# Patient Record
Sex: Female | Born: 1935 | Race: White | Hispanic: No | Smoking: Never smoker
Health system: Southern US, Community
[De-identification: ages and names within clinical notes are randomized; demographics above are authoritative.]

## PROBLEM LIST (undated history)

## (undated) DIAGNOSIS — E7800 Pure hypercholesterolemia, unspecified: Secondary | ICD-10-CM

## (undated) DIAGNOSIS — R1310 Dysphagia, unspecified: Secondary | ICD-10-CM

## (undated) DIAGNOSIS — K219 Gastro-esophageal reflux disease without esophagitis: Secondary | ICD-10-CM

## (undated) DIAGNOSIS — I2510 Atherosclerotic heart disease of native coronary artery without angina pectoris: Secondary | ICD-10-CM

## (undated) DIAGNOSIS — N2889 Other specified disorders of kidney and ureter: Secondary | ICD-10-CM

## (undated) DIAGNOSIS — I639 Cerebral infarction, unspecified: Secondary | ICD-10-CM

## (undated) DIAGNOSIS — Z8673 Personal history of transient ischemic attack (TIA), and cerebral infarction without residual deficits: Secondary | ICD-10-CM

## (undated) DIAGNOSIS — I10 Essential (primary) hypertension: Secondary | ICD-10-CM

## (undated) DIAGNOSIS — E785 Hyperlipidemia, unspecified: Secondary | ICD-10-CM

## (undated) DIAGNOSIS — Z818 Family history of other mental and behavioral disorders: Secondary | ICD-10-CM

## (undated) DIAGNOSIS — M7060 Trochanteric bursitis, unspecified hip: Secondary | ICD-10-CM

## (undated) DIAGNOSIS — Z780 Asymptomatic menopausal state: Secondary | ICD-10-CM

## (undated) DIAGNOSIS — M1990 Unspecified osteoarthritis, unspecified site: Secondary | ICD-10-CM

## (undated) DIAGNOSIS — H547 Unspecified visual loss: Secondary | ICD-10-CM

## (undated) DIAGNOSIS — I4891 Unspecified atrial fibrillation: Secondary | ICD-10-CM

## (undated) DIAGNOSIS — R569 Unspecified convulsions: Secondary | ICD-10-CM

## (undated) DIAGNOSIS — F0390 Unspecified dementia without behavioral disturbance: Secondary | ICD-10-CM

## (undated) HISTORY — DX: Atherosclerotic heart disease of native coronary artery without angina pectoris: I25.10

## (undated) HISTORY — DX: Pure hypercholesterolemia, unspecified: E78.00

## (undated) HISTORY — DX: Trochanteric bursitis, unspecified hip: M70.60

## (undated) HISTORY — DX: Unspecified visual loss: H54.7

## (undated) HISTORY — DX: Gastro-esophageal reflux disease without esophagitis: K21.9

## (undated) HISTORY — DX: Personal history of transient ischemic attack (TIA), and cerebral infarction without residual deficits: Z86.73

## (undated) HISTORY — DX: Family history of other mental and behavioral disorders: Z81.8

## (undated) HISTORY — DX: Unspecified dementia, unspecified severity, without behavioral disturbance, psychotic disturbance, mood disturbance, and anxiety: F03.90

## (undated) HISTORY — DX: Essential (primary) hypertension: I10

## (undated) HISTORY — DX: Asymptomatic menopausal state: Z78.0

---

## 2010-03-19 ENCOUNTER — Inpatient Hospital Stay (HOSPITAL_COMMUNITY): Admission: EM | Admit: 2010-03-19 | Discharge: 2010-03-20 | Payer: Self-pay | Admitting: Emergency Medicine

## 2010-03-19 ENCOUNTER — Ambulatory Visit: Payer: Self-pay | Admitting: Cardiology

## 2010-03-20 ENCOUNTER — Encounter: Payer: Self-pay | Admitting: Internal Medicine

## 2010-03-20 ENCOUNTER — Ambulatory Visit: Payer: Self-pay

## 2010-04-23 ENCOUNTER — Encounter: Payer: Self-pay | Admitting: Internal Medicine

## 2010-05-10 ENCOUNTER — Ambulatory Visit: Payer: Self-pay | Admitting: Internal Medicine

## 2010-05-10 DIAGNOSIS — E785 Hyperlipidemia, unspecified: Secondary | ICD-10-CM

## 2010-05-10 DIAGNOSIS — I2510 Atherosclerotic heart disease of native coronary artery without angina pectoris: Secondary | ICD-10-CM | POA: Insufficient documentation

## 2010-05-10 DIAGNOSIS — I10 Essential (primary) hypertension: Secondary | ICD-10-CM

## 2010-08-28 NOTE — Miscellaneous (Signed)
Summary: [facility] Progress Note   [facility] Cardiac Progress Note   Imported By: Roderic Ovens 05/16/2010 15:50:44  _____________________________________________________________________  External Attachment:    Type:   Image     Comment:   External Document

## 2010-08-28 NOTE — Assessment & Plan Note (Signed)
Summary: rov/jml   Visit Type:  Follow-up Primary Provider:  Dr Doristine Counter  CC:  no complaints.  History of Present Illness: Patient is an 77 year old who was admtted to [facility] aon 03/19/10 The patient was awoken that night by her husband who was acutely SOB.  EMS was called.  He was intubated.  During all of this she developed chest pains.  She too was admitted.  She rulded in for MI with a troponin of 1.1.  Cardiac cath showed mild CAD with 20% narrowings.  LV function was 50% with anterior hypokinesis.  It was felt that she had vasospasm.  She was treated medically. Of not during cath she had a short burst of tachycardia.   Not able to tell if afib. Since d/c she has had not further chest pains.  No palpitations.  Breathing is oK  Current Medications (verified): 1)  Aspirin 81 Mg  Tabs (Aspirin) .Marland Kitchen.. 1 Tab Once Daily 2)  Plavix 75 Mg Tabs (Clopidogrel Bisulfate) .... Take One Tablet By Mouth Daily X 1 Month 3)  Metoprolol Tartrate 25 Mg Tabs (Metoprolol Tartrate) .... Take One Half Tab Tablet By Mouth Twice A Day 4)  Nitrostat 0.4 Mg Subl (Nitroglycerin) .Marland Kitchen.. 1 Tablet Under Tongue At Onset of Chest Pain; You May Repeat Every 5 Minutes For Up To 3 Doses. 5)  Pravachol 40 Mg Tabs (Pravastatin Sodium) .Marland Kitchen.. 1 Tab Qd 6)  Furosemide .Marland Kitchen.. 1 Tab Once Daily  Allergies (verified): No Known Drug Allergies  Past History:  Family History: Last updated: 05/27/2010  Her mother died at 33 of old age.  Her father died at   24 of old age.  She has 7 siblings, none of which, have CAD.      Social History: Last updated: 2010/05/27  She lives in [place] with her husband.  She has 2   children.  She has a remote history of tobacco, but has not smoked in   years.  She denies any alcohol or IV drug use.   Past Medical History:  1. Dyslipidemia.   2. Hypertension.   3. CVA with left eye blindness.  4.  NSTEMI.  Mild CAD (20% LAD, LCx.  LVEF with 50% anterior hypokinesis)  Review of  Systems       BP is better at home.  Home cuff 100s  Vital Signs:  Patient profile:   77 year old female Height:      70 inches Weight:      150 pounds BMI:     21.60 Pulse rate:   70 / minute BP sitting:   158 / 88  (left arm) Cuff size:   regular  Vitals Entered By: Burnett Kanaris, CNA (May 27, 2010 11:45 AM)  Physical Exam  Additional Exam:  Pateint is in NAD HEENT:  Normocephalic, atraumatic. EOMI, PERRLA.  Neck: JVP is normal. No thyromegaly. No bruits.  Lungs: clear to auscultation. No rales no wheezes.  Heart: Regular rate and rhythm. Normal S1, S2. No S3.   No significant murmurs. PMI not displaced.  Abdomen:  Supple, nontender. Normal bowel sounds. No masses. No hepatomegaly.  Extremities:   Good distal pulses throughout. No lower extremity edema.  Musculoskeletal :moving all extremities.  Neuro:   alert and oriented x3.    EKG  Procedure date:  05/27/2010  Findings:      NSR.  71  Impression & Recommendations:  Problem # 1:  CAD, NATIVE VESSEL (ICD-414.01) Patient with recent NSTEMI.  Cath with mild dz.  NOte anterior hypokinesis on LV gram.  MAY have occurred secondary to spasm in setting of stress/increased catecholamines. Currently doing welll.  Volume looks good.  I would keep on same regimen.  D/C plavix when done. No recurrent rhythm problems as noted in cath lab.  Event monitor negative.  Problem # 2:  HYPERLIPIDEMIA-MIXED (ICD-272.4) Lipids will need to be followed. Her updated medication list for this problem includes:    Pravachol 40 Mg Tabs (Pravastatin sodium) .Marland Kitchen... 1 tab qd  Problem # 3:  ESSENTIAL HYPERTENSION, BENIGN (ICD-401.1) BP on my check was 120/80  Husband reports it is low at home.  Continue meds.  Patient is not dizzy.  Other Orders: EKG w/ Interpretation (93000)

## 2010-08-28 NOTE — Procedures (Signed)
Summary: Summary report  Summary report   Imported By: Erle Crocker 05/15/2010 11:47:06  _____________________________________________________________________  External Attachment:    Type:   Image     Comment:   External Document

## 2010-10-12 LAB — CBC
HCT: 32.5 % — ABNORMAL LOW (ref 36.0–46.0)
Hemoglobin: 11 g/dL — ABNORMAL LOW (ref 12.0–15.0)
MCH: 30.5 pg (ref 26.0–34.0)
MCH: 31.1 pg (ref 26.0–34.0)
MCHC: 33.8 g/dL (ref 30.0–36.0)
MCV: 90 fL (ref 78.0–100.0)
Platelets: 161 10*3/uL (ref 150–400)
RBC: 3.61 MIL/uL — ABNORMAL LOW (ref 3.87–5.11)
RBC: 3.96 MIL/uL (ref 3.87–5.11)
RDW: 12.8 % (ref 11.5–15.5)
WBC: 5.3 10*3/uL (ref 4.0–10.5)
WBC: 8.5 10*3/uL (ref 4.0–10.5)

## 2010-10-12 LAB — CARDIAC PANEL(CRET KIN+CKTOT+MB+TROPI)
CK, MB: 6.1 ng/mL (ref 0.3–4.0)
Relative Index: 5.4 — ABNORMAL HIGH (ref 0.0–2.5)
Relative Index: 5.7 — ABNORMAL HIGH (ref 0.0–2.5)
Total CK: 112 U/L (ref 7–177)
Total CK: 121 U/L (ref 7–177)
Troponin I: 1.63 ng/mL (ref 0.00–0.06)

## 2010-10-12 LAB — MRSA PCR SCREENING: MRSA by PCR: NEGATIVE

## 2010-10-12 LAB — PROTIME-INR: INR: 1.05 (ref 0.00–1.49)

## 2010-10-12 LAB — CK TOTAL AND CKMB (NOT AT ARMC)
CK, MB: 4.8 ng/mL — ABNORMAL HIGH (ref 0.3–4.0)
CK, MB: 5.8 ng/mL — ABNORMAL HIGH (ref 0.3–4.0)
Relative Index: 4.8 — ABNORMAL HIGH (ref 0.0–2.5)
Total CK: 101 U/L (ref 7–177)
Total CK: 111 U/L (ref 7–177)

## 2010-10-12 LAB — POCT CARDIAC MARKERS
CKMB, poc: 2.5 ng/mL (ref 1.0–8.0)
Myoglobin, poc: 93.2 ng/mL (ref 12–200)
Troponin i, poc: 0.48 ng/mL (ref 0.00–0.09)

## 2010-10-12 LAB — LIPID PANEL
Cholesterol: 173 mg/dL (ref 0–200)
HDL: 46 mg/dL (ref >39)
LDL Cholesterol: 102 mg/dL — ABNORMAL HIGH (ref 0–99)
Total CHOL/HDL Ratio: 3.8 RATIO
Triglycerides: 126 mg/dL (ref <150)
VLDL: 25 mg/dL (ref 0–40)

## 2010-10-12 LAB — POCT I-STAT, CHEM 8
BUN: 11 mg/dL (ref 6–23)
HCT: 37 % (ref 36.0–46.0)
Hemoglobin: 12.6 g/dL (ref 12.0–15.0)
Sodium: 137 mEq/L (ref 135–145)

## 2010-10-12 LAB — TSH: TSH: 3.654 u[IU]/mL (ref 0.350–4.500)

## 2010-10-12 LAB — COMPREHENSIVE METABOLIC PANEL
AST: 29 U/L (ref 0–37)
Alkaline Phosphatase: 77 U/L (ref 39–117)
BUN: 10 mg/dL (ref 6–23)
Calcium: 9 mg/dL (ref 8.4–10.5)
Creatinine, Ser: 1.01 mg/dL (ref 0.4–1.2)
GFR calc Af Amer: >60 mL/min (ref >60)
GFR calc non Af Amer: 52 mL/min — ABNORMAL LOW (ref >60)
Glucose, Bld: 127 mg/dL — ABNORMAL HIGH (ref 70–99)
Potassium: 3.7 mEq/L (ref 3.5–5.1)
Sodium: 138 mEq/L (ref 135–145)
Total Protein: 7.1 g/dL (ref 6.0–8.3)

## 2010-10-12 LAB — DIFFERENTIAL
Basophils Relative: 0 % (ref 0–1)
Neutro Abs: 5.9 10*3/uL (ref 1.7–7.7)

## 2010-10-12 LAB — TROPONIN I
Troponin I: 1.1 ng/mL (ref 0.00–0.06)
Troponin I: 1.31 ng/mL (ref 0.00–0.06)

## 2010-10-12 LAB — HEPARIN LEVEL (UNFRACTIONATED): Heparin Unfractionated: 0.24 IU/mL — ABNORMAL LOW (ref 0.30–0.70)

## 2011-03-08 ENCOUNTER — Other Ambulatory Visit: Payer: Self-pay | Admitting: Orthopedic Surgery

## 2011-03-08 ENCOUNTER — Ambulatory Visit (HOSPITAL_COMMUNITY)
Admission: RE | Admit: 2011-03-08 | Discharge: 2011-03-08 | Disposition: A | Discharge status: Home / Self Care | Payer: Medicare Other | Source: Ambulatory Visit | Attending: Orthopedic Surgery | Admitting: Orthopedic Surgery

## 2011-03-08 ENCOUNTER — Other Ambulatory Visit (HOSPITAL_COMMUNITY): Payer: Self-pay | Admitting: Orthopedic Surgery

## 2011-03-08 ENCOUNTER — Encounter (HOSPITAL_COMMUNITY): Payer: Medicare Other

## 2011-03-08 DIAGNOSIS — M169 Osteoarthritis of hip, unspecified: Secondary | ICD-10-CM | POA: Insufficient documentation

## 2011-03-08 DIAGNOSIS — Z01812 Encounter for preprocedural laboratory examination: Secondary | ICD-10-CM | POA: Insufficient documentation

## 2011-03-08 DIAGNOSIS — Z01818 Encounter for other preprocedural examination: Secondary | ICD-10-CM

## 2011-03-08 DIAGNOSIS — M1610 Unilateral primary osteoarthritis, unspecified hip: Secondary | ICD-10-CM | POA: Insufficient documentation

## 2011-03-08 DIAGNOSIS — I10 Essential (primary) hypertension: Secondary | ICD-10-CM | POA: Insufficient documentation

## 2011-03-08 LAB — SURGICAL PCR SCREEN
MRSA, PCR: NEGATIVE
Staphylococcus aureus: NEGATIVE

## 2011-03-08 LAB — URINALYSIS, ROUTINE W REFLEX MICROSCOPIC
Bilirubin Urine: NEGATIVE
Urobilinogen, UA: 0.2 mg/dL (ref 0.0–1.0)

## 2011-03-08 LAB — URINE MICROSCOPIC-ADD ON

## 2011-03-08 LAB — CBC
HCT: 38.6 % (ref 36.0–46.0)
Hemoglobin: 13.3 g/dL (ref 12.0–15.0)
MCHC: 34.5 g/dL (ref 30.0–36.0)
MCV: 89.1 fL (ref 78.0–100.0)
Platelets: 269 10*3/uL (ref 150–400)

## 2011-03-08 LAB — COMPREHENSIVE METABOLIC PANEL
ALT: 14 U/L (ref 0–35)
AST: 19 U/L (ref 0–37)
Albumin: 3.7 g/dL (ref 3.5–5.2)
Alkaline Phosphatase: 88 U/L (ref 39–117)
BUN: 15 mg/dL (ref 6–23)
CO2: 30 mEq/L (ref 19–32)
Chloride: 103 mEq/L (ref 96–112)
Creatinine, Ser: 0.95 mg/dL (ref 0.50–1.10)
GFR calc non Af Amer: 56 mL/min — ABNORMAL LOW (ref >60)
Glucose, Bld: 106 mg/dL — ABNORMAL HIGH (ref 70–99)
Total Bilirubin: 0.4 mg/dL (ref 0.3–1.2)
Total Protein: 7.7 g/dL (ref 6.0–8.3)

## 2011-03-18 ENCOUNTER — Inpatient Hospital Stay (HOSPITAL_COMMUNITY): Payer: Medicare Other

## 2011-03-18 ENCOUNTER — Inpatient Hospital Stay (HOSPITAL_COMMUNITY)
Admission: RE | Admit: 2011-03-18 | Discharge: 2011-03-21 | DRG: 470 | Disposition: A | Discharge status: Home / Self Care | Payer: Medicare Other | Source: Ambulatory Visit | Attending: Orthopedic Surgery | Admitting: Orthopedic Surgery

## 2011-03-18 DIAGNOSIS — Z01812 Encounter for preprocedural laboratory examination: Secondary | ICD-10-CM

## 2011-03-18 DIAGNOSIS — I10 Essential (primary) hypertension: Secondary | ICD-10-CM | POA: Diagnosis present

## 2011-03-18 DIAGNOSIS — M169 Osteoarthritis of hip, unspecified: Principal | ICD-10-CM | POA: Diagnosis present

## 2011-03-18 DIAGNOSIS — M1610 Unilateral primary osteoarthritis, unspecified hip: Principal | ICD-10-CM | POA: Diagnosis present

## 2011-03-18 DIAGNOSIS — R112 Nausea with vomiting, unspecified: Secondary | ICD-10-CM | POA: Diagnosis not present

## 2011-03-18 DIAGNOSIS — I2510 Atherosclerotic heart disease of native coronary artery without angina pectoris: Secondary | ICD-10-CM | POA: Diagnosis present

## 2011-03-18 LAB — TYPE AND SCREEN: Antibody Screen: NEGATIVE

## 2011-03-19 LAB — CBC
MCH: 30.8 pg (ref 26.0–34.0)
MCHC: 34.8 g/dL (ref 30.0–36.0)
Platelets: 234 10*3/uL (ref 150–400)
RDW: 12.3 % (ref 11.5–15.5)

## 2011-03-19 LAB — BASIC METABOLIC PANEL
Calcium: 8.6 mg/dL (ref 8.4–10.5)
GFR calc non Af Amer: >60 mL/min (ref >60)
Sodium: 133 mEq/L — ABNORMAL LOW (ref 135–145)

## 2011-03-20 LAB — BASIC METABOLIC PANEL
CO2: 29 mEq/L (ref 19–32)
Calcium: 8.8 mg/dL (ref 8.4–10.5)
Chloride: 102 mEq/L (ref 96–112)
Sodium: 136 mEq/L (ref 135–145)

## 2011-03-20 LAB — CBC
HCT: 28.6 % — ABNORMAL LOW (ref 36.0–46.0)
Hemoglobin: 9.6 g/dL — ABNORMAL LOW (ref 12.0–15.0)
MCH: 30.2 pg (ref 26.0–34.0)
MCHC: 33.6 g/dL (ref 30.0–36.0)
MCV: 89.9 fL (ref 78.0–100.0)
Platelets: 208 10*3/uL (ref 150–400)
RBC: 3.18 MIL/uL — ABNORMAL LOW (ref 3.87–5.11)
RDW: 12.4 % (ref 11.5–15.5)
WBC: 7.8 10*3/uL (ref 4.0–10.5)

## 2011-03-21 LAB — CBC
MCV: 89.3 fL (ref 78.0–100.0)
Platelets: 202 10*3/uL (ref 150–400)
RBC: 3.08 MIL/uL — ABNORMAL LOW (ref 3.87–5.11)
WBC: 7 10*3/uL (ref 4.0–10.5)

## 2011-03-28 NOTE — Op Note (Signed)
NAMEPASCHA, FOGAL NO.:  [id number]  MEDICAL RECORD NO.:  [id number]  LOCATION:  1614                         FACILITY:  [facility]  PHYSICIAN:  Ollen Gross, M.D.    DATE OF BIRTH:  06-11-1926  DATE OF PROCEDURE: DATE OF DISCHARGE:                              OPERATIVE REPORT   PREOPERATIVE DIAGNOSIS:  Osteoarthritis, right hip.  POSTOPERATIVE DIAGNOSIS:  Osteoarthritis, right hip.  PROCEDURE:  Right total hip arthroplasty.  SURGEON:  Ollen Gross, M.D.  ASSISTANT:  Alexzandrew L. Perkins, P.A.C.  ANESTHESIA:  General.  ESTIMATED BLOOD LOSS:  250.  DRAINS:  Hemovac x1.  COMPLICATIONS:  None.  CONDITION:  Stable to the Recovery.  BRIEF CLINICAL NOTE:  Ms. Baria is an 77 year old female with severe end-stage osteoarthritis of the right hip, bone-on-bone with severe pain and dysfunction.  She has had progressively worsening pain and progressively worsening function.  She has tried assistive devices,analgesics, and the pain persists.  She presents now for right total hip arthroplasty.  PROCEDURE IN DETAIL:  After successful administration of general anesthetic, the patient was placed in the left lateral decubitus position with the right side up and held with the hip positioner.  Right lower extremity was isolated from perineum with plastic drapes, and prepped and draped in the usual sterile fashion.  Short posterolateral incision was made with a 10 blade through the subcutaneous tissue to the level of fascia lata, which was incised in line with the skin incision. The sciatic nerve was palpated and protected, and the short rotators and capsule were isolated off the femur.  The hips dislocated and the center of femoral head was marked.  Trial prosthesis was placed such that the center of the trial head corresponds to the center of her native femoral head.  Osteotomy lines marked on the femoral neck and osteotomy was made with an oscillating saw.   The femoral head was removed and the retractors were placed  around the proximal femur to gain access to the canal.  The femoral canal was identified and entered with the starter reamer. The canal was thoroughly irrigated with saline to remove the fatty contents.  Lateralizing reamer was then placed.  We broached up to a size seven for the Summit cemented stem.  Broaches left in place.  The femur was then retracted anteriorly to gain acetabular exposure. Acetabular reaming was performed to 55 mm.  A 56-mm pinnacle acetabular shell was placed in anatomic position and transfixed with two dome screws.  Apex hole eliminator was placed and a 36-mm neutral plus 4 marathon liner was placed.  The trial neck was placed which is the 7 standard with a 36 +1 head.  The hips reduced with outstanding stability.  There was full extension, full external rotation, 70 degrees flexion, 40 degrees adduction, 90 degrees internal rotation, 90 degrees of flexion with 90 degrees of internal rotation.  By placing the right leg on top of the left, it felt as though leg lengths were equal.  The hip was dislocated and the trials were removed.  Sponge was placed in the acetabulum and the size specific  cement restrictor was trialed. The size #4 cement restrictor  was then placed in the appropriate depth in the femoral canal.  The canal was thoroughly prepared with pulsatile lavage and then the cement was mixed.  Once ready for implantation, the canal was thoroughly dried and then the cement injected and pressurized. The DePuy Summit size 7 cemented stem was then placed cementing in native anteversion.  All extruded cements removed.  Cement fully hardened, then the 32 +1.5 head was placed.  Hips reduced at the same stability parameters.  The wound was copiously irrigated with saline solution and then the short rotators and capsule reattached to the femur through drill holes with Ethibond suture.  The fascia lata was  closed over Hemovac drain with interrupted #1 Vicryl subcu.  The Exparel was injected, this was 20 mL mixed with 50 mL of saline.  It was injected into the fascia lata with the gluteal muscles and the subcutaneous tissues.  Subcu was then closed with interrupted #1-0 and #2-0 Vicryl and subcuticular running 4-0 Monocryl.  Incisions cleaned and dried and Steri-Strips and bulky sterile dressing were applied.  She was then placed into a knee immobilizer, awakened and transported to recovery in stable condition.     Ollen Gross, M.D.     FA/MEDQ  D:  03/18/2011  T:  03/19/2011  Job:  [id number]  Electronically Signed by Ollen Gross M.D. on 03/28/2011 04:03:35 PM

## 2011-04-03 NOTE — H&P (Addendum)
NAMELARAINE, SAMET NO.:  [id number]  MEDICAL RECORD NO.:  [id number]  LOCATION:                                 FACILITY:  PHYSICIAN:  Ollen Gross, M.D.    DATE OF BIRTH:  1926-03-03  DATE OF ADMISSION:  03/18/2011 DATE OF DISCHARGE:                             HISTORY & PHYSICAL   DATE OF ADMISSION:  03/18/2011  CHIEF COMPLAINT:  Right hip pain.  HISTORY OF PRESENT ILLNESS:  The patient is an 77 year old female who has seen by Dr. Lequita Halt for ongoing severe hip pain, has been progressively getting worse throughout the year.  Pain is in the lateral groin and radiating down.  She has had more difficulty and the pain has become quite severe and is limiting her functionality.  She has already advanced to the point where she has end-stage bone-on-bone with collapse of the femoral head superolaterally.  She has failed nonoperative management, now presents for a total knee arthroplasty.ALLERGIES:  No known drug allergies.  CURRENT MEDICATIONS:  Benazepril, furosemide, metoprolol, fish oil, aspirin, and pravastatin.  PAST MEDICAL HISTORY:  Atherosclerotic coronary vascular disease, hypertension, history of dementia, reflux disease, hypercholesterolemia, legal blindness left eye, history of trochanteric bursitis, history of ocular stroke, and postmenopausal.  PAST SURGICAL HISTORY:  Excision of Bartholin's gland and a hysterectomy.  FAMILY HISTORY:  Father deceased at age 96.  Mother deceased at age 23.  SOCIAL HISTORY:  Married, nonsmoker.  No alcohol.  Two adopted children. She does have a caregiver lined up.  She has one step entering her home.  REVIEW OF SYSTEMS:  GENERAL:  No fevers, chills or night sweats. NEUROLOGIC:  No seizures, syncope or paralysis.  She has ocular strokes and legally blind in the left eye, though.  RESPIRATORY:  No shortness of breath, productive cough or hemoptysis.  CARDIOVASCULAR:  No chest pain, orthopnea.  GI:  She does  have reflux.  No nausea, vomiting, diarrhea or constipation.  GU: No dysuria, hematuria or discharge. MUSCULOSKELETAL:  Hip pain.  PHYSICAL EXAM:  VITAL SIGNS:  Pulse 80, respirations 12, blood pressure 148/82. GENERAL:  77 year old white female, well-nourished, well-developed, in no acute distress.  She is alert, oriented and cooperative.  She is accompanied by her daughter, Ines Bloomer.  She does have upper and lower dentures. HEENT:  Normocephalic, atraumatic.  Pupils, round and reactive. Decreased vision in the left eye.  She has peripheral vision. NECK:  Supple. CHEST:  Clear. HEART:  Regular rate and rhythm without murmur, S1, S2 noted. ABDOMEN:  Soft, nontender, slightly round.  Bowel sounds present. RECTAL, BREASTS, GENITALIA:  Not done.  Not pertinant to present illness. EXTREMITIES:  Right hip:  Flexion 90, 0 internal rotation, 5 degrees external rotation, about 220 degrees abduction.  Right knee is 0 to 135 on passive range of motion.  Slight crepitation.  She does ambulate with an antalgic gait.  IMPRESSION:  Osteoarthritis, right hip.  PLAN:  The patient will be admitted to [facility] to undergo right total hip replacement arthroplasty.  Surgery will be performed by Dr. Ollen Gross.  There are no active contraindications for surgery and she has plans on going home  after the hospital stay.  She has been seen preoperatively by Dr. Doristine Counter and felt to be stable for surgery.  Dictated For Ollen Gross, MD     Alexzandrew L. Julien Girt, P.A.C.   ______________________________ Ollen Gross, M.D.    ALP/MEDQ  D:  03/24/2011  T:  03/24/2011  Job:  [id number]  cc:   Marjory Lies, M.D. Fax: [phone]  Electronically Signed by Patrica Duel P.A.C. on 04/03/2011 09:59:05 AM Electronically Signed by Ollen Gross M.D. on 04/03/2011 10:03:58 AM Electronically Signed by Ollen Gross M.D. on 04/03/2011 10:04:01 AM

## 2011-04-07 NOTE — Discharge Summary (Signed)
NAMELEYA, PAIGE NO.:  [id number]  MEDICAL RECORD NO.:  [id number]  LOCATION:  1614                         FACILITY:  [facility]  PHYSICIAN:  Ollen Gross, M.D.    DATE OF BIRTH:  July 02, 1926  DATE OF ADMISSION:  03/18/2011 DATE OF DISCHARGE:  03/21/2011                              DISCHARGE SUMMARY   ADMITTING DIAGNOSES: 1. Osteoarthritis, right hip. 2. Atherosclerotic coronary vascular disease. 3. Hypertension. 4. History of dementia. 5. Reflux disease. 6. Hypercholesterolemia. 7. Left eye legal blind. 8. History of trochanteric bursitis. 9. History of ocular stroke. 10.Postmenopausal.  DISCHARGE DIAGNOSES: 1. Osteoarthritis right hip, status post right total hip replacement     arthroplasty. 2. Mild postop acute blood loss anemia, did not require transfusion. 3. Postop hyponatremia, improved. 4. Atherosclerotic coronary vascular disease. 5. Hypertension. 6. History of dementia. 7. Reflux disease. 8. Hypercholesterolemia. 9. Left eye legal blind. 10.History of trochanteric bursitis. 11.History of ocular stroke. 12.Postmenopausal.  PROCEDURE:  March 18, 2011, right total hip.  Surgeon, Dr. Lequita Halt. Assistant, Patrica Duel, PA-C.  Anesthesia general.  Blood loss 250 cc.  CONSULTS:  None.  BRIEF HISTORY:  [name] is an 77 year old female with severe end- stage arthritis of the right hip, bone on bone, severe pain, and dysfunction.  She has failed nonoperative management, progressive worsening function, and now presents for total hip.  LABORATORY DATA:  Admission CBC is not scanned into the chart, not found in the computer chart, but hemoglobin starting was 13.3; postop hemoglobin down to 10.1 to 9.6; last done hemoglobin and hematocrit showed 9.4 and 27.5.  Admission Chem panel not scanned into the chart, not available, but the followup BMET showed a sodium low at 133, but it came back up to 136.  Remaining electrolytes all  remained within normal limits.  Blood group type O positive.  X-RAYS:  Postop pelvis and hip film shows the status post right total hip replacement without acute complicating features.  HOSPITAL COURSE:  The patient was admitted to [facility], taken to OR, underwent above-stated procedure without complication.  The patient tolerated procedure well, later transferred to recovery room, and then to the orthopedic floor.  Started on p.o. and IV analgesics. Given 24 hours postop IV antibiotics.  She was started on Xarelto for DVT prophylaxis.  Started getting up out of bed on day #1.  She had a decent night, a little bit of nausea and vomiting, but that had been resolved by day #1.  Sodium was a little low, felt to be a dilutional component, but she had good urinary output.  Pressure was stable. Starting getting up out of bed.  Started back on her blood pressure medications except for the ACE inhibitor that was held temporarily until her pressure was stable and she is moving around with established renal function.  By day #2, she was doing well, already sitting up in the chair on morning rounds.  Dressing changed, incision looked good. Hemoglobin was down to 9.6.  She was asymptomatic with this.  Her pressure was stable.  Sodium was already back up.  She continued to work with therapy and by day #3, she was up with  therapy, progressing with her activity goals, tolerating her medications, and was discharged home.  DISCHARGE PLAN: 1. The patient was discharged home on March 21, 2011. 2. Discharge diagnoses, please see above. 3. Discharge medications, OxyIR, Robaxin, Xarelto.  Continue home     medications, benazepril, furosemide, and sublingual nitroglycerin. 4. Diet, heart-healthy diet. 5. Activity, she is partial weightbearing 25% to 50%, hip precautions,     total hip protocol. 6. Follow up in 2 weeks.  DISPOSITION:  Home.  CONDITION UPON DISCHARGE:   Improved.     Alexzandrew L. Julien Girt, P.A.C.   ______________________________ Ollen Gross, M.D.    ALP/MEDQ  D:  04/03/2011  T:  04/03/2011  Job:  [id number]  cc:   Marjory Lies, M.D. Fax: [phone]  Electronically Signed by Patrica Duel P.A.C. on 04/04/2011 08:14:33 AM Electronically Signed by Ollen Gross M.D. on 04/07/2011 12:16:41 PM

## 2015-04-02 ENCOUNTER — Encounter (HOSPITAL_COMMUNITY): Payer: Self-pay | Admitting: *Deleted

## 2015-04-02 ENCOUNTER — Emergency Department (HOSPITAL_COMMUNITY): Payer: Medicare Other

## 2015-04-02 ENCOUNTER — Observation Stay (HOSPITAL_COMMUNITY)
Admission: EM | Admit: 2015-04-02 | Discharge: 2015-04-05 | Disposition: A | Discharge status: Home / Self Care | Payer: Medicare Other | Attending: Internal Medicine | Admitting: Internal Medicine

## 2015-04-02 DIAGNOSIS — Z8673 Personal history of transient ischemic attack (TIA), and cerebral infarction without residual deficits: Secondary | ICD-10-CM | POA: Diagnosis not present

## 2015-04-02 DIAGNOSIS — F0390 Unspecified dementia without behavioral disturbance: Secondary | ICD-10-CM | POA: Diagnosis present

## 2015-04-02 DIAGNOSIS — F03918 Unspecified dementia, unspecified severity, with other behavioral disturbance: Secondary | ICD-10-CM | POA: Insufficient documentation

## 2015-04-02 DIAGNOSIS — E785 Hyperlipidemia, unspecified: Secondary | ICD-10-CM | POA: Diagnosis not present

## 2015-04-02 DIAGNOSIS — I10 Essential (primary) hypertension: Secondary | ICD-10-CM | POA: Insufficient documentation

## 2015-04-02 DIAGNOSIS — G459 Transient cerebral ischemic attack, unspecified: Secondary | ICD-10-CM | POA: Diagnosis not present

## 2015-04-02 DIAGNOSIS — H5442 Blindness, left eye, normal vision right eye: Secondary | ICD-10-CM | POA: Diagnosis not present

## 2015-04-02 DIAGNOSIS — F0391 Unspecified dementia with behavioral disturbance: Secondary | ICD-10-CM | POA: Insufficient documentation

## 2015-04-02 DIAGNOSIS — Z7982 Long term (current) use of aspirin: Secondary | ICD-10-CM | POA: Diagnosis not present

## 2015-04-02 DIAGNOSIS — I2510 Atherosclerotic heart disease of native coronary artery without angina pectoris: Secondary | ICD-10-CM | POA: Insufficient documentation

## 2015-04-02 DIAGNOSIS — I69354 Hemiplegia and hemiparesis following cerebral infarction affecting left non-dominant side: Secondary | ICD-10-CM | POA: Diagnosis not present

## 2015-04-02 DIAGNOSIS — I480 Paroxysmal atrial fibrillation: Secondary | ICD-10-CM | POA: Diagnosis not present

## 2015-04-02 DIAGNOSIS — Z66 Do not resuscitate: Secondary | ICD-10-CM | POA: Diagnosis not present

## 2015-04-02 DIAGNOSIS — I4891 Unspecified atrial fibrillation: Secondary | ICD-10-CM | POA: Insufficient documentation

## 2015-04-02 LAB — I-STAT TROPONIN, ED: TROPONIN I, POC: 0 ng/mL (ref 0.00–0.08)

## 2015-04-02 LAB — I-STAT CHEM 8, ED
BUN: 14 mg/dL (ref 6–20)
CALCIUM ION: 1.11 mmol/L — AB (ref 1.13–1.30)
CHLORIDE: 98 mmol/L — AB (ref 101–111)
CREATININE: 1.2 mg/dL — AB (ref 0.44–1.00)
Glucose, Bld: 91 mg/dL (ref 65–99)
HCT: 34 % — ABNORMAL LOW (ref 36.0–46.0)
Hemoglobin: 11.6 g/dL — ABNORMAL LOW (ref 12.0–15.0)
Potassium: 3.6 mmol/L (ref 3.5–5.1)
Sodium: 137 mmol/L (ref 135–145)
TCO2: 24 mmol/L (ref 0–100)

## 2015-04-02 LAB — CBC
HEMATOCRIT: 33 % — AB (ref 36.0–46.0)
HEMOGLOBIN: 11.1 g/dL — AB (ref 12.0–15.0)
MCH: 29.7 pg (ref 26.0–34.0)
MCHC: 33.6 g/dL (ref 30.0–36.0)
MCV: 88.2 fL (ref 78.0–100.0)
Platelets: 225 10*3/uL (ref 150–400)
RBC: 3.74 MIL/uL — AB (ref 3.87–5.11)
RDW: 13.7 % (ref 11.5–15.5)
WBC: 6.3 10*3/uL (ref 4.0–10.5)

## 2015-04-02 LAB — COMPREHENSIVE METABOLIC PANEL
ALK PHOS: 73 U/L (ref 38–126)
ALT: 12 U/L — AB (ref 14–54)
AST: 25 U/L (ref 15–41)
Albumin: 3.3 g/dL — ABNORMAL LOW (ref 3.5–5.0)
Anion gap: 6 (ref 5–15)
BILIRUBIN TOTAL: 0.7 mg/dL (ref 0.3–1.2)
BUN: 13 mg/dL (ref 6–20)
CALCIUM: 9 mg/dL (ref 8.9–10.3)
CO2: 27 mmol/L (ref 22–32)
CREATININE: 1.23 mg/dL — AB (ref 0.44–1.00)
Chloride: 102 mmol/L (ref 101–111)
GFR calc Af Amer: 44 mL/min — ABNORMAL LOW (ref >60)
GFR, EST NON AFRICAN AMERICAN: 38 mL/min — AB (ref >60)
GLUCOSE: 94 mg/dL (ref 65–99)
Potassium: 3.6 mmol/L (ref 3.5–5.1)
Sodium: 135 mmol/L (ref 135–145)
TOTAL PROTEIN: 5.8 g/dL — AB (ref 6.5–8.1)

## 2015-04-02 LAB — DIFFERENTIAL
BASOS ABS: 0 10*3/uL (ref 0.0–0.1)
Basophils Relative: 1 % (ref 0–1)
EOS ABS: 0.1 10*3/uL (ref 0.0–0.7)
Eosinophils Relative: 1 % (ref 0–5)
LYMPHS ABS: 1.8 10*3/uL (ref 0.7–4.0)
LYMPHS PCT: 29 % (ref 12–46)
MONOS PCT: 8 % (ref 3–12)
Monocytes Absolute: 0.5 10*3/uL (ref 0.1–1.0)
NEUTROS ABS: 3.9 10*3/uL (ref 1.7–7.7)
Neutrophils Relative %: 61 % (ref 43–77)

## 2015-04-02 LAB — URINALYSIS, ROUTINE W REFLEX MICROSCOPIC
Glucose, UA: NEGATIVE mg/dL
HGB URINE DIPSTICK: NEGATIVE
Ketones, ur: 15 mg/dL — AB
Leukocytes, UA: NEGATIVE
Nitrite: NEGATIVE
Protein, ur: 30 mg/dL — AB
SPECIFIC GRAVITY, URINE: 1.021 (ref 1.005–1.030)
Urobilinogen, UA: 2 mg/dL — ABNORMAL HIGH (ref 0.0–1.0)
pH: 6 (ref 5.0–8.0)

## 2015-04-02 LAB — URINE MICROSCOPIC-ADD ON

## 2015-04-02 LAB — APTT: APTT: 36 s (ref 24–37)

## 2015-04-02 LAB — CBG MONITORING, ED: Glucose-Capillary: 91 mg/dL (ref 65–99)

## 2015-04-02 LAB — PROTIME-INR
INR: 1.24 (ref 0.00–1.49)
Prothrombin Time: 15.7 seconds — ABNORMAL HIGH (ref 11.6–15.2)

## 2015-04-02 NOTE — ED Provider Notes (Signed)
CSN: [id number]     Arrival date & time 04/02/15  1852 History   First MD Initiated Contact with Patient 04/02/15 1855     Chief Complaint  Patient presents with  . Code Stroke   Patient is a 81 y.o. female presenting with general illness. The history is provided by the patient and the EMS personnel. No language interpreter was used.  Illness Location:  NA Quality:  Facial droop, eye deviation, weakness Severity:  Moderate Onset quality:  Sudden Duration: minutes. Timing:  Constant Progression:  Resolved Chronicity:  New Context:  PMHx of HTN, HLD, CAD, previous CVA, and dementia presenting from home for sudden collapse at home associated with rightward eye deviation and left sided weakness. Per EMS patient was at home and normal prior to 6 PM. At the time patient went to the bathroom and was found to have collapsed on the floor. Eye deviation, left-sided weakness, and decrease responsiveness noted at that time. Symptoms resolved in route.   Past Medical History  Diagnosis Date  . Atherosclerotic cardiovascular disease   . Hypertension   . Dementia   . FH: dementia   . Acid reflux   . Hypercholesteremia   . Blindness     legal blindness,left eye,histor  . H/O: stroke     ocular stroke,   . Postmenopausal   . Trochanteric bursitis    History reviewed. No pertinent past surgical history. No family history on file. Social History  Substance Use Topics  . Smoking status: None  . Smokeless tobacco: None  . Alcohol Use: None   OB History    No data available      Review of Systems  Neurological: Positive for facial asymmetry, speech difficulty and weakness.  All other systems reviewed and are negative.   Allergies  Review of patient's allergies indicates no known allergies.  Home Medications   Prior to Admission medications   Medication Sig Start Date End Date Taking? Authorizing Provider  aspirin 81 MG EC tablet Take 81 mg by mouth at bedtime. Swallow whole.   Yes  Historical Provider, MD  benazepril (LOTENSIN) 20 MG tablet Take 20 mg by mouth 2 (two) times daily.    Yes Historical Provider, MD  ibuprofen (ADVIL,MOTRIN) 200 MG tablet Take 200 mg by mouth every 6 (six) hours as needed (pain).   Yes Historical Provider, MD  metoprolol tartrate (LOPRESSOR) 25 MG tablet Take 25 mg by mouth 2 (two) times daily.    Yes Historical Provider, MD   BP 129/65 mmHg  Pulse 78  Temp(Src) 98.5 F (36.9 C) (Oral)  Resp 14  SpO2 98%   Physical Exam  Constitutional: She is oriented to person, place, and time. No distress.  HENT:  Head: Normocephalic and atraumatic.  Eyes: Conjunctivae are normal. Pupils are equal, round, and reactive to light.  Neck: Normal range of motion. Neck supple.  Cardiovascular: Normal rate and regular rhythm.   Pulmonary/Chest: Effort normal and breath sounds normal.  Abdominal: Soft. Bowel sounds are normal.  Musculoskeletal: Normal range of motion.  Neurological: She is alert and oriented to person, place, and time.  Pupils equal and reactive bilaterally. Extraocular movements intact. Alert and oriented 3. Moving all extremities. Sensation grossly intact.  Skin: Skin is warm and dry. She is not diaphoretic.    ED Course  Procedures   Labs Review Labs Reviewed  PROTIME-INR - Abnormal; Notable for the following:    Prothrombin Time 15.7 (*)    All other components within normal  limits  CBC - Abnormal; Notable for the following:    RBC 3.74 (*)    Hemoglobin 11.1 (*)    HCT 33.0 (*)    All other components within normal limits  COMPREHENSIVE METABOLIC PANEL - Abnormal; Notable for the following:    Creatinine, Ser 1.23 (*)    Total Protein 5.8 (*)    Albumin 3.3 (*)    ALT 12 (*)    GFR calc non Af Amer 38 (*)    GFR calc Af Amer 44 (*)    All other components within normal limits  URINALYSIS, ROUTINE W REFLEX MICROSCOPIC (NOT AT [facility]) - Abnormal; Notable for the following:    Bilirubin Urine SMALL (*)    Ketones, ur 15  (*)    Protein, ur 30 (*)    Urobilinogen, UA 2.0 (*)    All other components within normal limits  URINE MICROSCOPIC-ADD ON - Abnormal; Notable for the following:    Casts HYALINE CASTS (*)    All other components within normal limits  I-STAT CHEM 8, ED - Abnormal; Notable for the following:    Chloride 98 (*)    Creatinine, Ser 1.20 (*)    Calcium, Ion 1.11 (*)    Hemoglobin 11.6 (*)    HCT 34.0 (*)    All other components within normal limits  APTT  DIFFERENTIAL  I-STAT TROPOININ, ED  CBG MONITORING, ED   Imaging Review Ct Head Wo Contrast  04/02/2015   CLINICAL DATA:  81 year old female with findings concerning for Stroke.  EXAM: CT HEAD WITHOUT CONTRAST  TECHNIQUE: Contiguous axial images were obtained from the base of the skull through the vertex without intravenous contrast.  COMPARISON:  None.  FINDINGS: Evaluation of this exam is limited due to motion artifact.  The ventricles are dilated and the sulci are prominent compatible with age-related atrophy. Periventricular and deep white matter hypodensities represent chronic microvascular ischemic changes. There is no intracranial hemorrhage. No mass effect or midline shift identified.  The visualized paranasal sinuses and mastoid air cells are well aerated. The calvarium is intact.  IMPRESSION: No acute intracranial pathology.  Age-related atrophy and chronic microvascular ischemic disease.  If symptoms persist and there are no contraindications, MRI may provide better evaluation if clinically indicated.  These results were called by telephone at the time of interpretation on 04/02/2015 at 7:16 pm to Dr. Jaci Carrel , who verbally acknowledged these results.   Electronically Signed   By: Elgie Collard M.D.   On: 04/02/2015 19:16   I have personally reviewed and evaluated these images and lab results as part of my medical decision-making.   EKG Interpretation None      MDM  Ms. colon is a 81 yo female with PMHx of HTN,  HLD, CAD, previous CVA, and dementia presenting from home for sudden collapse at home associated with rightward eye deviation and left sided weakness. Per EMS patient was at home and normal prior to 6 PM. At the time patient went to the bathroom and was found to have collapsed on the floor. Eye deviation, left-sided weakness, and decrease responsiveness noted at that time. Symptoms resolved in route.  Exam above notable for elderly female lying in stretcher in no acute distress. Pupils equal and reactive bilaterally. Extraocular movements intact. Alert and oriented 3. Moving all extremities. Sensation grossly intact.  Labs obtained in relatively unremarkable aside from creatinine of 1.23. CT head without contrast showing no acute intracranial pathology. Patient's presentation is most consistent  with TIA. Neurology was present from initial evaluation and recommends admission for further workup.  Patient admitted to hospitalist for further evaluation and management of presumed TIA. Patient understands and agrees plan has no further questions or concerns time.  Patient care discussed with and followed by my attending, Dr. Dutch Quint   Final diagnoses:  Transient cerebral ischemia, unspecified transient cerebral ischemia type    Angelina Ok, MD 04/02/15 2358  Gilda Crease, MD 04/02/15 [phone]

## 2015-04-02 NOTE — ED Notes (Signed)
Pt has dementia and daughter reported to EMS that pt is at her baseline.

## 2015-04-02 NOTE — Consult Note (Signed)
Admission H&P    Chief Complaint: Transient left hemiparesthesias.  HPI: Joan Mathis is an 81 y.o. female with a history of hypertension, hyperlipidemia, previous stroke and dementia who was brought to the emergency room and code stroke status after acute onset of eye deviation to the right side and left hemiparesis at 6 PM today. Deficits cleared in 5-10 minutes. She's been taking aspirin 81 mg per day. CT scan of her head showed no acute intracranial abnormality. NIH stroke score was 4, including disorientation, as well as difficulty following commands.  LSN: 1800 on 04/02/2015 tPA Given: No: Rapidly resolving deficits mRankin:  Past Medical History  Diagnosis Date  . Atherosclerotic cardiovascular disease   . Hypertension   . Dementia   . FH: dementia   . Acid reflux   . Hypercholesteremia   . Blindness     legal blindness,left eye,histor  . H/O: stroke     ocular stroke,   . Postmenopausal   . Trochanteric bursitis     History reviewed. No pertinent past surgical history.  Family history: Reviewed from previous admission and was noncontributory.  Social History:  has no tobacco, alcohol, and drug history on file.  Allergies: No Known Allergies  Medications: Patient's preadmission medications were reviewed by me.  ROS: Unavailable due to patient's mental status changes.  Physical Examination: There were no vitals taken for this visit.  HEENT-  Normocephalic, no lesions, without obvious abnormality.  Normal external eye and conjunctiva.  Normal TM's bilaterally.  Normal auditory canals and external ears. Normal external nose, mucus membranes and septum.  Normal pharynx. Neck supple with no masses, nodes, nodules or enlargement. Cardiovascular - regular rate and rhythm, S1, S2 normal, no murmur, click, rub or gallop Lungs - chest clear, no wheezing, rales, normal symmetric air entry Abdomen - soft, non-tender; bowel sounds normal; no masses,  no  organomegaly Extremities - no joint deformities, effusion, or inflammation, moderate edema bilaterally  Neurologic Examination: Mental Status: Alert, disoriented to current age and month, emotionally incontinent.  Speech fluent without evidence of aphasia. Able to follow commands without difficulty. Cranial Nerves: II-Visual fields were normal. III/IV/VI-Pupils were equal and reacted normally to light. Extraocular movements were full and conjugate.    V/VII-no facial numbness and no facial weakness. VIII-normal. X-normal speech and symmetrical palatal movement. XI: trapezius strength/neck flexion strength normal bilaterally XII-midline tongue extension with normal strength. Motor: 5/5 bilaterally with normal tone and bulk Sensory: Unreliable responses to tactile stimulation. Deep Tendon Reflexes: Trace to 1+ and symmetric; moderate frontal release signs. Plantars: Flexor bilaterally Cerebellar: Normal finger-to-nose testing. Carotid auscultation: Normal  Results for orders placed or performed during the hospital encounter of 04/02/15 (from the past 48 hour(s))  I-stat troponin, ED (not at [facility], [facility])     Status: None   Collection Time: 04/02/15  7:00 PM  Result Value Ref Range   Troponin i, poc 0.00 0.00 - 0.08 ng/mL   Comment 3            Comment: Due to the release kinetics of cTnI, a negative result within the first hours of the onset of symptoms does not rule out myocardial infarction with certainty. If myocardial infarction is still suspected, repeat the test at appropriate intervals.   Protime-INR     Status: Abnormal   Collection Time: 04/02/15  7:01 PM  Result Value Ref Range   Prothrombin Time 15.7 (H) 11.6 - 15.2 seconds   INR 1.24 0.00 - 1.49  APTT  Status: None   Collection Time: 04/02/15  7:01 PM  Result Value Ref Range   aPTT 36 24 - 37 seconds  CBC     Status: Abnormal   Collection Time: 04/02/15  7:01 PM  Result Value Ref Range   WBC 6.3 4.0 - 10.5 K/uL    RBC 3.74 (L) 3.87 - 5.11 MIL/uL   Hemoglobin 11.1 (L) 12.0 - 15.0 g/dL   HCT 16.1 (L) 09.6 - 04.5 %   MCV 88.2 78.0 - 100.0 fL   MCH 29.7 26.0 - 34.0 pg   MCHC 33.6 30.0 - 36.0 g/dL   RDW 40.9 81.1 - 91.4 %   Platelets 225 150 - 400 K/uL  Differential     Status: None   Collection Time: 04/02/15  7:01 PM  Result Value Ref Range   Neutrophils Relative % 61 43 - 77 %   Neutro Abs 3.9 1.7 - 7.7 K/uL   Lymphocytes Relative 29 12 - 46 %   Lymphs Abs 1.8 0.7 - 4.0 K/uL   Monocytes Relative 8 3 - 12 %   Monocytes Absolute 0.5 0.1 - 1.0 K/uL   Eosinophils Relative 1 0 - 5 %   Eosinophils Absolute 0.1 0.0 - 0.7 K/uL   Basophils Relative 1 0 - 1 %   Basophils Absolute 0.0 0.0 - 0.1 K/uL  I-Stat Chem 8, ED  (not at [facility], [facility])     Status: Abnormal   Collection Time: 04/02/15  7:02 PM  Result Value Ref Range   Sodium 137 135 - 145 mmol/L   Potassium 3.6 3.5 - 5.1 mmol/L   Chloride 98 (L) 101 - 111 mmol/L   BUN 14 6 - 20 mg/dL   Creatinine, Ser 7.82 (H) 0.44 - 1.00 mg/dL   Glucose, Bld 91 65 - 99 mg/dL   Calcium, Ion 9.56 (L) 1.13 - 1.30 mmol/L   TCO2 24 0 - 100 mmol/L   Hemoglobin 11.6 (L) 12.0 - 15.0 g/dL   HCT 21.3 (L) 08.6 - 57.8 %  CBG monitoring, ED     Status: None   Collection Time: 04/02/15  7:17 PM  Result Value Ref Range   Glucose-Capillary 91 65 - 99 mg/dL   Ct Head Wo Contrast  04/02/2015   CLINICAL DATA:  81 year old female with findings concerning for Stroke.  EXAM: CT HEAD WITHOUT CONTRAST  TECHNIQUE: Contiguous axial images were obtained from the base of the skull through the vertex without intravenous contrast.  COMPARISON:  None.  FINDINGS: Evaluation of this exam is limited due to motion artifact.  The ventricles are dilated and the sulci are prominent compatible with age-related atrophy. Periventricular and deep white matter hypodensities represent chronic microvascular ischemic changes. There is no intracranial hemorrhage. No mass effect or midline shift identified.   The visualized paranasal sinuses and mastoid air cells are well aerated. The calvarium is intact.  IMPRESSION: No acute intracranial pathology.  Age-related atrophy and chronic microvascular ischemic disease.  If symptoms persist and there are no contraindications, MRI may provide better evaluation if clinically indicated.  These results were called by telephone at the time of interpretation on 04/02/2015 at 7:16 pm to Dr. Jaci Carrel , who verbally acknowledged these results.   Electronically Signed   By: Elgie Collard M.D.   On: 04/02/2015 19:16    Assessment: 81 y.o. female with multiple risk factors for stroke as well as previous stroke and dementia presenting with probable right MCA territory TIA. Recurrent acute stroke cannot  be ruled out, however.  Stroke Risk Factors - hyperlipidemia and hypertension  Plan: 1. HgbA1c, fasting lipid panel 2. MRI, MRA  of the brain without contrast 3. PT consult, OT consult, Speech consult 4. Echocardiogram 5. Carotid dopplers 6. Prophylactic therapy-Antiplatelet med: Aspirin  7. Risk factor modification 8. Telemetry monitoring  C.R. Roseanne Reno, MD [facility] [phone]  04/02/2015, 7:23 PM

## 2015-04-02 NOTE — ED Provider Notes (Signed)
Patient presented to the ER with possible stroke. Patient had syncopal episode approximately 1 hour before arrival. EMS reports that the patient was found on the floor with left-sided flaccid paralysis and rightward gaze preference. Patient was brought to the ER, but 5 minutes before arrival to the ER, her left-sided weakness and gaze preference has completely resolved.  Face to face Exam: HEENT - PERRLA Lungs - CTAB Heart - RRR, no M/R/G Abd - S/NT/ND Neuro - alert, oriented x3  Plan: Stroke workup  Gilda Crease, MD 04/02/15 1909

## 2015-04-02 NOTE — ED Notes (Signed)
Pt arrives via EMS from home. Pt walked to the bathroom at 1800 and had a possible syncopal episode. First responder states that pt could not gaze to the left, had left sided weakness and unresponsive pupils. Symptoms subsided enroute.

## 2015-04-02 NOTE — H&P (Signed)
[facility] Admission History and Physical       Joan Mathis WUJ:[id number] DOB: [birth year] DOA: 04/02/2015  Referring physician: EDP PCP: Delorse Lek, MD  Specialists:   Chief Complaint: Left sided Weakness  HPI: Joan Mathis is a 81 y.o. female with a history of HTN, Hyperlipidemia, and Dementia who presented to the ED after having symptoms of left sided weakness and right ward gaze and decreased responsiveness while she was trying to be assisted to the Bathroom.   Her Daughter is at the bedside and givens the history and reports that the symptoms began around 6 pm.   EMS was called and by the time they arrive to [facility] her symptoms had resolved.   A TIA Workup was initiated and a CT scan of the Head was performed and was negative for acute findings.      Review of Systems:  Constitutional: No Weight Loss, No Weight Gain, Night Sweats, Fevers, Chills, Dizziness, Light Headedness, Fatigue, or Generalized Weakness HEENT: No Headaches, Difficulty Swallowing,Tooth/Dental Problems,Sore Throat,  No Sneezing, Rhinitis, Ear Ache, Nasal Congestion, or Post Nasal Drip,  Cardio-vascular:  No Chest pain, Orthopnea, PND, Edema in Lower Extremities, Anasarca, Dizziness, Palpitations  Resp: No Dyspnea, No DOE, No Productive Cough, No Non-Productive Cough, No Hemoptysis, No Wheezing.    GI: No Heartburn, Indigestion, Abdominal Pain, Nausea, Vomiting, Diarrhea, Constipation, Hematemesis, Hematochezia, Melena, Change in Bowel Habits,  Loss of Appetite  GU: No Dysuria, No Change in Color of Urine, No Urgency or Urinary Frequency, No Flank pain.  Musculoskeletal: No Joint Pain or Swelling, No Decreased Range of Motion, No Back Pain.  Neurologic: No Syncope, No Seizures,+Left sided Weakness, Muscle Weakness, Paresthesia, Vision Disturbance or Loss, Diplopia, Slurred Speech,  No Vertigo, No Difficulty Walking,  Skin: No Rash or Lesions. Psych: No Change in Mood or Affect, No Depression or  Anxiety, No Memory loss, Confusion, No Hallucinations   Past Medical History  Diagnosis Date  . Atherosclerotic cardiovascular disease   . Hypertension   . Dementia   . FH: dementia   . Acid reflux   . Hypercholesteremia   . Blindness     legal blindness,left eye,histor  . H/O: stroke     ocular stroke,   . Postmenopausal   . Trochanteric bursitis      History reviewed. No pertinent past surgical history.    Prior to Admission medications   Medication Sig Start Date End Date Taking? Authorizing Provider  aspirin 81 MG EC tablet Take 81 mg by mouth at bedtime. Swallow whole.   Yes Historical Provider, MD  benazepril (LOTENSIN) 20 MG tablet Take 20 mg by mouth 2 (two) times daily.    Yes Historical Provider, MD  ibuprofen (ADVIL,MOTRIN) 200 MG tablet Take 200 mg by mouth every 6 (six) hours as needed (pain).   Yes Historical Provider, MD  metoprolol tartrate (LOPRESSOR) 25 MG tablet Take 25 mg by mouth 2 (two) times daily.    Yes Historical Provider, MD     No Known Allergies  Social History:  has no tobacco, alcohol, and drug history on file.     No family history on file.     Physical Exam:  GEN:  Pleasant and Confused Well Nourished and Well devel 81 y.o. female examined and in no acute distress; cooperative with exam Filed Vitals:   04/02/15 2256 04/02/15 2300 04/02/15 2315 04/02/15 2330  BP: 131/66 118/57 121/65 129/65  Pulse: 84 80 76 78  Temp:      TempSrc:  Resp: 18 17 16 14   SpO2: 99% 98% 98% 98%   Blood pressure 129/65, pulse 78, temperature 98.5 F (36.9 C), temperature source Oral, resp. rate 14, SpO2 98 %. PSYCH: She is alert and oriented x1; does not appear anxious does not appear depressed; affect is normal HEENT: Normocephalic and Atraumatic, Mucous membranes pink; PERRLA; EOM intact; Fundi:  Benign;  No scleral icterus, Nares: Patent, Oropharynx: Clear, Edentulous with Dentures Present,    Neck:  FROM, No Cervical Lymphadenopathy nor  Thyromegaly or Carotid Bruit; No JVD; Breasts:: Not examined CHEST WALL: No tenderness CHEST: Normal respiration, clear to auscultation bilaterally HEART: Irregular rate and rhythm; no murmurs rubs or gallops BACK: No kyphosis or scoliosis; No CVA tenderness ABDOMEN: Positive Bowel Sounds, Soft Non-Tender, No Rebound or Guarding; No Masses, No Organomegaly, No Pannus; No Intertriginous candida. Rectal Exam: Not done EXTREMITIES: No Cyanosis, Clubbing, or Edema; No Ulcerations. Genitalia: not examined PULSES: 2+ and symmetric SKIN: Normal hydration no rash or ulceration CNS:  Alert and Oriented x1, No Focal Deficits  Mental Status:   Speech Fluent without evidence of Aphasia. Able to follow 3 step commands without difficulty.  In No obvious pain.    Cranial Nerves:  II: Discs flat bilaterally; Visual fields Intact, Pupils equal and reactive.     III,IV, VI: Extra-ocular motions intact bilaterally     V,VII: smile symmetric, facial light touch sensation normal bilaterally     VIII: hearing intact bilaterally     IX,X: gag reflex present     XI: bilateral shoulder shrug     XII: midline tongue extension    Motor:  Right:  Upper extremity 5/5     Left:  Upper extremity 5/5      Right:  Lower extremity 5/5    Left:  Lower extremity 5/5      Tone and Bulk:  normal tone throughout; no atrophy noted    Sensory:  Pinprick and light touch intact throughout, bilaterally    Deep Tendon Reflexes: 2+ and symmetric throughout    Plantars/ Babinski:  Right: Normal Left:  Normal     Cerebellar:  Finger to nose without difficulty.    Gait: deferred    Vascular: pulses palpable throughout    Labs on Admission:  Basic Metabolic Panel:  Recent Labs Lab 04/02/15 1901 04/02/15 1902  NA 135 137  K 3.6 3.6  CL 102 98*  CO2 27  --   GLUCOSE 94 91  BUN 13 14  CREATININE 1.23* 1.20*  CALCIUM 9.0  --    Liver Function Tests:  Recent Labs Lab 04/02/15 1901  AST 25  ALT 12*  ALKPHOS  73  BILITOT 0.7  PROT 5.8*  ALBUMIN 3.3*   No results for input(s): LIPASE, AMYLASE in the last 168 hours. No results for input(s): AMMONIA in the last 168 hours. CBC:  Recent Labs Lab 04/02/15 1901 04/02/15 1902  WBC 6.3  --   NEUTROABS 3.9  --   HGB 11.1* 11.6*  HCT 33.0* 34.0*  MCV 88.2  --   PLT 225  --    Cardiac Enzymes: No results for input(s): CKTOTAL, CKMB, CKMBINDEX, TROPONINI in the last 168 hours.  BNP (last 3 results) No results for input(s): BNP in the last 8760 hours.  ProBNP (last 3 results) No results for input(s): PROBNP in the last 8760 hours.  CBG:  Recent Labs Lab 04/02/15 1917  GLUCAP 91    Radiological Exams on Admission: Ct Head Wo Contrast  04/02/2015   CLINICAL DATA:  81 year old female with findings concerning for Stroke.  EXAM: CT HEAD WITHOUT CONTRAST  TECHNIQUE: Contiguous axial images were obtained from the base of the skull through the vertex without intravenous contrast.  COMPARISON:  None.  FINDINGS: Evaluation of this exam is limited due to motion artifact.  The ventricles are dilated and the sulci are prominent compatible with age-related atrophy. Periventricular and deep white matter hypodensities represent chronic microvascular ischemic changes. There is no intracranial hemorrhage. No mass effect or midline shift identified.  The visualized paranasal sinuses and mastoid air cells are well aerated. The calvarium is intact.  IMPRESSION: No acute intracranial pathology.  Age-related atrophy and chronic microvascular ischemic disease.  If symptoms persist and there are no contraindications, MRI may provide better evaluation if clinically indicated.  These results were called by telephone at the time of interpretation on 04/02/2015 at 7:16 pm to Dr. Jaci Carrel , who verbally acknowledged these results.   Electronically Signed   By: Elgie Collard M.D.   On: 04/02/2015 19:16     EKG: Independently reviewed. Sinus Tachycardia rate  =104, Old anterior Infarct changes, +PVC   Assessment/Plan:     81 y.o. female with  Principal Problem:   1.    TIA (transient ischemic attack)   TIA workup   Cardiac Monitoring   MRI/MRA Brain  Ordered   Carotid US, and 2D ECHO in AM   Fasting Lipids and HbA1C in AM        Active Problems:   2.     Essential hypertension, benign   Continue Metoprolol   Monitor BPs     3.     CAD, NATIVE VESSEL   On Metoprolol, ASA     4.     Hyperlipidemia   Add Atorvatatin     5.     Dementia without behavioral disturbance   chronic     6.     DVT Prophylaxis   Lovenox     Code Status:      DO NOT RESUSCITATE (DNR)      Family Communication:   Daughter at Bedside     Disposition Plan:    Observation Status        Time spent:  41 Minutes      Ron Parker [facility] Pager [phone]   If 7AM -7PM Please Contact the Day Rounding Team MD for [facility]  If 7PM-7AM, Please Contact Night-Floor Coverage  [email or web address] Password [email or web address] 04/02/2015, 11:42 PM     ADDENDUM:   Patient was seen and examined on 04/02/2015

## 2015-04-03 ENCOUNTER — Other Ambulatory Visit (HOSPITAL_COMMUNITY): Payer: Medicare Other

## 2015-04-03 ENCOUNTER — Observation Stay (HOSPITAL_COMMUNITY): Payer: Medicare Other

## 2015-04-03 DIAGNOSIS — G451 Carotid artery syndrome (hemispheric): Secondary | ICD-10-CM | POA: Diagnosis not present

## 2015-04-03 DIAGNOSIS — F03918 Unspecified dementia, unspecified severity, with other behavioral disturbance: Secondary | ICD-10-CM | POA: Insufficient documentation

## 2015-04-03 DIAGNOSIS — E785 Hyperlipidemia, unspecified: Secondary | ICD-10-CM

## 2015-04-03 DIAGNOSIS — I2510 Atherosclerotic heart disease of native coronary artery without angina pectoris: Secondary | ICD-10-CM

## 2015-04-03 DIAGNOSIS — F0390 Unspecified dementia without behavioral disturbance: Secondary | ICD-10-CM | POA: Diagnosis present

## 2015-04-03 DIAGNOSIS — F0391 Unspecified dementia with behavioral disturbance: Secondary | ICD-10-CM

## 2015-04-03 DIAGNOSIS — I4891 Unspecified atrial fibrillation: Secondary | ICD-10-CM

## 2015-04-03 DIAGNOSIS — I10 Essential (primary) hypertension: Secondary | ICD-10-CM

## 2015-04-03 DIAGNOSIS — G459 Transient cerebral ischemic attack, unspecified: Secondary | ICD-10-CM | POA: Diagnosis not present

## 2015-04-03 LAB — LIPID PANEL
Cholesterol: 189 mg/dL (ref 0–200)
HDL: 56 mg/dL (ref >40)
LDL CALC: 125 mg/dL — AB (ref 0–99)
TRIGLYCERIDES: 40 mg/dL (ref <150)
Total CHOL/HDL Ratio: 3.4 RATIO
VLDL: 8 mg/dL (ref 0–40)

## 2015-04-03 MED ORDER — ATORVASTATIN CALCIUM 10 MG PO TABS
10.0000 mg | ORAL_TABLET | Freq: Every day | ORAL | Status: DC
  Administered 2015-04-03 – 2015-04-04 (×2): 10 mg via ORAL
  Filled 2015-04-03 (×2): qty 1

## 2015-04-03 MED ORDER — STROKE: EARLY STAGES OF RECOVERY BOOK
Freq: Once | Status: AC
  Administered 2015-04-03: 01:00:00

## 2015-04-03 MED ORDER — ASPIRIN 325 MG PO TABS
325.0000 mg | ORAL_TABLET | Freq: Every day | ORAL | Status: DC
  Administered 2015-04-03 – 2015-04-05 (×4): 325 mg via ORAL
  Filled 2015-04-03 (×4): qty 1

## 2015-04-03 MED ORDER — HALOPERIDOL LACTATE 5 MG/ML IJ SOLN
2.0000 mg | Freq: Four times a day (QID) | INTRAMUSCULAR | Status: DC | PRN
  Filled 2015-04-03: qty 1

## 2015-04-03 MED ORDER — ENOXAPARIN SODIUM 40 MG/0.4ML ~~LOC~~ SOLN
40.0000 mg | Freq: Every day | SUBCUTANEOUS | Status: DC
  Administered 2015-04-03 – 2015-04-05 (×3): 40 mg via SUBCUTANEOUS
  Filled 2015-04-03 (×3): qty 0.4

## 2015-04-03 MED ORDER — ASPIRIN 325 MG PO TABS: 325.0000 mg | ORAL_TABLET | Freq: Every day | ORAL | Status: DC

## 2015-04-03 MED ORDER — HALOPERIDOL 1 MG PO TABS: 2.0000 mg | ORAL_TABLET | Freq: Four times a day (QID) | ORAL | Status: DC | PRN

## 2015-04-03 MED ORDER — SENNOSIDES-DOCUSATE SODIUM 8.6-50 MG PO TABS: 1.0000 | ORAL_TABLET | Freq: Every evening | ORAL | Status: DC | PRN

## 2015-04-03 MED ORDER — BENAZEPRIL HCL 20 MG PO TABS
20.0000 mg | ORAL_TABLET | Freq: Two times a day (BID) | ORAL | Status: DC
  Administered 2015-04-03 – 2015-04-05 (×6): 20 mg via ORAL
  Filled 2015-04-03 (×6): qty 1

## 2015-04-03 MED ORDER — METOPROLOL TARTRATE 25 MG PO TABS
25.0000 mg | ORAL_TABLET | Freq: Two times a day (BID) | ORAL | Status: DC
  Administered 2015-04-03 – 2015-04-04 (×5): 25 mg via ORAL
  Filled 2015-04-03 (×6): qty 1

## 2015-04-03 NOTE — Progress Notes (Signed)
Pt arrived to room 5M07 from ED. Pt is alert, but only oriented to self.  Tele applied and safety measures in place.  Will continue to monitor.   Estanislado Emms, RN

## 2015-04-03 NOTE — Progress Notes (Signed)
PROGRESS NOTE  Joan Mathis ZOX:[id number] DOB: [birth year] DOA: 04/02/2015 PCP: Delorse Lek, MD  Assessment/Plan: TIA (transient ischemic attack) TIA workup MRI/MRA Brain Ordered- not sure patient will be able to tolerate Carotid US, and 2D ECHO in AM LDL: 125   HgbA1C   Essential hypertension, benign Continue Metoprolol Monitor BPs    CAD, NATIVE VESSEL On Metoprolol, ASA    Hyperlipidemia Add Atorvatatin    Dementia without behavioral disturbance -chronic -back to baseline per daughter-- needs sitter for impulsiveness  Code Status: DNR Family Communication: patient Disposition Plan:    Consultants:  neuro  Procedures:      HPI/Subjective: Back to baseline per daughter at bedside  Objective: Filed Vitals:   04/03/15 0700  BP: 155/83  Pulse: 63  Temp: 98.6 F (37 C)  Resp: 14    Intake/Output Summary (Last 24 hours) at 04/03/15 1024 Last data filed at 04/03/15 0845  Gross per 24 hour  Intake    240 ml  Output      0 ml  Net    240 ml   Filed Weights   04/03/15 0000  Weight: 71.94 kg (158 lb 9.6 oz)    Exam:   General:  Awake, pleasantly confused  Cardiovascular: rrr  Respiratory: clear  Abdomen: +BS, soft  Musculoskeletal: no edema, follows commands but hard of hearing  Data Reviewed: Basic Metabolic Panel:  Recent Labs Lab 04/02/15 1901 04/02/15 1902  NA 135 137  K 3.6 3.6  CL 102 98*  CO2 27  --   GLUCOSE 94 91  BUN 13 14  CREATININE 1.23* 1.20*  CALCIUM 9.0  --    Liver Function Tests:  Recent Labs Lab 04/02/15 1901  AST 25  ALT 12*  ALKPHOS 73  BILITOT 0.7  PROT 5.8*  ALBUMIN 3.3*   No results for input(s): LIPASE, AMYLASE in the last 168 hours. No results for input(s): AMMONIA in  the last 168 hours. CBC:  Recent Labs Lab 04/02/15 1901 04/02/15 1902  WBC 6.3  --   NEUTROABS 3.9  --   HGB 11.1* 11.6*  HCT 33.0* 34.0*  MCV 88.2  --   PLT 225  --    Cardiac Enzymes: No results for input(s): CKTOTAL, CKMB, CKMBINDEX, TROPONINI in the last 168 hours. BNP (last 3 results) No results for input(s): BNP in the last 8760 hours.  ProBNP (last 3 results) No results for input(s): PROBNP in the last 8760 hours.  CBG:  Recent Labs Lab 04/02/15 1917  GLUCAP 91    No results found for this or any previous visit (from the past 240 hour(s)).   Studies: Ct Head Wo Contrast  04/02/2015   CLINICAL DATA:  81 year old female with findings concerning for Stroke.  EXAM: CT HEAD WITHOUT CONTRAST  TECHNIQUE: Contiguous axial images were obtained from the base of the skull through the vertex without intravenous contrast.  COMPARISON:  None.  FINDINGS: Evaluation of this exam is limited due to motion artifact.  The ventricles are dilated and the sulci are prominent compatible with age-related atrophy. Periventricular and deep white matter hypodensities represent chronic microvascular ischemic changes. There is no intracranial hemorrhage. No mass effect or midline shift identified.  The visualized paranasal sinuses and mastoid air cells are well aerated. The calvarium is intact.  IMPRESSION: No acute intracranial pathology.  Age-related atrophy and chronic microvascular ischemic disease.  If symptoms persist and there are no contraindications, MRI may provide better evaluation if clinically indicated.  These results were called  by telephone at the time of interpretation on 04/02/2015 at 7:16 pm to Dr. Jaci Carrel , who verbally acknowledged these results.   Electronically Signed   By: Elgie Collard M.D.   On: 04/02/2015 19:16    Scheduled Meds: . aspirin  325 mg Oral Daily  . atorvastatin  10 mg Oral q1800  . benazepril  20 mg Oral BID  . enoxaparin (LOVENOX) injection  40  mg Subcutaneous Daily  . metoprolol tartrate  25 mg Oral BID   Continuous Infusions:  Antibiotics Given (last 72 hours)    None      Principal Problem:   TIA (transient ischemic attack) Active Problems:   Essential hypertension, benign   CAD, NATIVE VESSEL   Hyperlipidemia   Dementia without behavioral disturbance    Time spent: 25 min    Nathanel Tallman  [facility] Pager [phone]. If 7PM-7AM, please contact night-coverage at [email or web address], password [facility] 04/03/2015, 10:24 AM

## 2015-04-03 NOTE — Progress Notes (Signed)
PT Cancellation Note  Patient Details Name: Joan Mathis MRN: [id number] DOB: 17-Jun-1926   Cancelled Treatment:    Reason Eval/Treat Not Completed: Other (comment). Pt very agitated and combative but then became very tearful. Assisted RN with transferring pt back into bed. PT to return as able.   Marcene Brawn 04/03/2015, 3:34 PM   Lewis Shock, PT, DPT Pager #: [phone] Office #: [phone]

## 2015-04-03 NOTE — Progress Notes (Signed)
STROKE TEAM PROGRESS NOTE   SUBJECTIVE (INTERVAL HISTORY) Her daughter and sitter are at the bedside.  Overall, patient is demented, but she feels her condition is stable. She is happy and laughing. Intermittently follows commands. Symptoms since resolved. Daughter confirmed she is in her baseline.   OBJECTIVE Temp:  [97.9 F (36.6 C)-98.6 F (37 C)] 98.6 F (37 C) (09/05 0700) Pulse Rate:  [54-105] 63 (09/05 0700) Cardiac Rhythm:  [-] Sinus bradycardia (09/05 1115) Resp:  [14-20] 14 (09/05 0700) BP: (102-186)/(57-95) 155/83 mmHg (09/05 0700) SpO2:  [94 %-100 %] 96 % (09/05 0700) Weight:  [71.94 kg (158 lb 9.6 oz)] 71.94 kg (158 lb 9.6 oz) (09/05 0000)  CBC:   Recent Labs Lab 04/02/15 1901 04/02/15 1902  WBC 6.3  --   NEUTROABS 3.9  --   HGB 11.1* 11.6*  HCT 33.0* 34.0*  MCV 88.2  --   PLT 225  --     Basic Metabolic Panel:   Recent Labs Lab 04/02/15 1901 04/02/15 1902  NA 135 137  K 3.6 3.6  CL 102 98*  CO2 27  --   GLUCOSE 94 91  BUN 13 14  CREATININE 1.23* 1.20*  CALCIUM 9.0  --     Lipid Panel:     Component Value Date/Time   CHOL 189 04/03/2015 0516   TRIG 40 04/03/2015 0516   HDL 56 04/03/2015 0516   CHOLHDL 3.4 04/03/2015 0516   VLDL 8 04/03/2015 0516   LDLCALC 125* 04/03/2015 0516   HgbA1c: No results found for: HGBA1C Urine Drug Screen: No results found for: LABOPIA, COCAINSCRNUR, LABBENZ, AMPHETMU, THCU, LABBARB    IMAGING  Ct Head Wo Contrast  04/02/2015    IMPRESSION: No acute intracranial pathology.  Age-related atrophy and chronic microvascular ischemic disease.  If symptoms persist and there are no contraindications, MRI may provide better evaluation if clinically indicated.    MRI and MRA pending  2D echo - pending  CUS - pending   PHYSICAL EXAM  Temp:  [97.9 F (36.6 C)-98.6 F (37 C)] 98.1 F (36.7 C) (09/05 1223) Pulse Rate:  [53-105] 60 (09/05 1350) Resp:  [14-20] 18 (09/05 1350) BP: (102-186)/(57-95) 148/74 mmHg  (09/05 1350) SpO2:  [94 %-100 %] 96 % (09/05 0700) Weight:  [158 lb 9.6 oz (71.94 kg)] 158 lb 9.6 oz (71.94 kg) (09/05 0000)  General - Well nourished, well developed, in no apparent distress, very pleasant but laughing inappropriately.  Ophthalmologic - Fundi not visualized due to noncooperation.  Cardiovascular - Regular rate and rhythm with occasional premature beat.  Mental Status -  Awake, alert, very pleasant, laughing inappropriately, not answering questions apparently. Did not answer questions for orientation, only laughing. Able to repeat very simple sentences with severe dysarthria. Not able to name, but able to follow some simple commands.  Cranial Nerves II - XII - II - blinking to visual threat bilaterally. III, IV, VI - ice attention to both directions. V - Facial sensation intact bilaterally. VII - Facial movement intact bilaterally. VIII - not cooperative on exam. X - not cooperative on exam. XI - not cooperative on exam. XII - Tongue protrusion intact.  Motor Strength - The patient's strength was normal on spontaneous movement in all extremities.  Bulk was normal and fasciculations were absent.   Motor Tone - Muscle tone was assessed at the neck and appendages and was normal.  Reflexes - The patient's reflexes were 1+ in all extremities and she had no pathological reflexes.  Sensory - Light touch, temperature/pinprick were assessed and were symmetrical.    Coordination - not cooperative on exam.  Tremor was absent.  Gait and Station - deferred.   ASSESSMENT/PLAN [name] is a 81 y.o. female with history of hypertension, hyperlipidemia, previous stroke and dementia presenting with transient left hemiparesthesias. She did not receive IV t-PA due to rapidly resolving deficits. EKG suspicious for A. fib with RVR.  Stroke:  Probable R brain infarct embolic secondary to atrial fibrillation, workup underway  Resultant transient right gaze and left-sided  weakness  MRI  pending   MRA  pending   Carotid Doppler  pending   2D Echo  pending   LDL 125  HgbA1c pending  Lovenox 40 mg sq daily for VTE prophylaxis Diet Heart Room service appropriate?: Yes; Fluid consistency:: Thin  aspirin 81 mg orally every day prior to admission, now on aspirin 325 mg orally every day. Due to patient advanced dementia, she is not to be a good candidate for anticoagulation. Continue aspirin 325.  Ongoing aggressive stroke risk factor management  Therapy recommendations:  No OT  Disposition:  Anticipate return home  Atrial Fibrillation  On admitted EKGs  No known hx atrial fibrillation   Home anticoagulation:  none  CHA2DS2-VASc Score = 7, ?2 oral anticoagulation recommended  Age in Years:  ?55   +2    Sex:  Female   Female   +1    Hypertension History:  yes   +1     Diabetes Mellitus:  0   Congestive Heart Failure History:  0  Vascular Disease History:  yes   +1     Stroke/TIA/Thromboembolism History:  yes   +2  Consider anticoagulation based on test results. However, due to patient advanced dementia, she is not to be a good candidate for anticoagulation. Continue aspirin 325.  Advanced dementia with behavioral symptoms (sundowning)  On Haldol PRN  May consider seroquel for behavior disturbance  Essential Hypertension  Stable  Permissive hypertension (OK if < 220/120) but gradually normalize in 5-7 days  Hyperlipidemia  Home meds:  No statin  LDL 125, goal < 70  New lipitor 10 mg added  Continue statin at discharge  Other Stroke Risk Factors  Advanced age  Hx  - CRAO L eye (legally blind)  Coronary artery disease  Other Active Problems  Baseline dementia  Hospital day #   Rhoderick Moody [facility] See Amion for Pager information 04/03/2015 2:06 PM   I, the attending vascular neurologist, have personally obtained a history, examined the patient, evaluated laboratory data, individually viewed imaging  studies and agree with radiology interpretations. I obtained additional history from pt's daughter at bedside. I also discussed with daughter and Dr. Benjamine Mola regarding her care plan. Together with the NP/PA, we formulated the assessment and plan of care which reflects our mutual decision.  I have made any additions or clarifications directly to the above note and agree with the findings and plan as currently documented.   81 year old female with history hypertension, hyperlipidemia, advanced dementia,? Stroke on aspirin 81 admitted for transient episode of right-sided gaze and left weakness. Symptoms resolved, patient had baseline this morning. However in afternoon patient had agitation, likely sundowning. She received Haldol. Recommend seroquel for further behavior control. EKG found the patient had A. fib with RVR, however patient not a good candidate for anticoagulation, continue aspirin and statin. MRI, carotid Doppler, 2-D echo are pending.  Marvel Plan, MD PhD Stroke Neurology 04/03/2015 5:50  PM    To contact Stroke Continuity provider, please refer to [email or web address] After hours, contact General Neurology

## 2015-04-03 NOTE — Progress Notes (Signed)
Occupational Therapy Evaluation Patient Details Name: Joan Mathis MRN: [id number] DOB: December 30, 1925 Today's Date: 04/03/2015    History of Present Illness 81 y.o. female with a history of hypertension, hyperlipidemia, previous stroke and dementia who was brought to the emergency room and code stroke status after acute onset of eye deviation to the right side and left hemiparesis CT scan of her head showed no acute intracranial abnormality. NIH stroke score was 4, including disorientation, as well as difficulty following commands.   Clinical Impression   Attempted to call family member to discuss PLOF; however, unable.  Apparently daughter had been visiting with sitter in room this am and relayed that pt's son lives with her 24/7 and assists as needed. Pt apparently at her baseline cognitively. Pt requires HHA +1 with mobility and steady A during ADL. PTA, pt ambulated with a cane.  Pt will initially need steady A for all ADL and mobility after D/C. Pt safe to D/C home with 24/7 S and steady assist. OT signing off.     Follow Up Recommendations  No OT follow up;Supervision/Assistance - 24 hour    Equipment Recommendations  Other (comment) (3 in 1 for shower if family does not have one)    Recommendations for Other Services       Precautions / Restrictions Precautions Precautions: Fall Restrictions Weight Bearing Restrictions: No      Mobility Bed Mobility Overal bed mobility: Modified Independent                Transfers Overall transfer level: Needs assistance   Transfers: Sit to/from Stand;Stand Pivot Transfers Sit to Stand: Min guard Stand pivot transfers: Min assist       General transfer comment: unsteady at times    Balance Overall balance assessment: Needs assistance Sitting-balance support: Feet supported Sitting balance-Leahy Scale: Good     Standing balance support: During functional activity Standing balance-Leahy Scale: Fair                               ADL Overall ADL's : At baseline                                       General ADL Comments: HHA to ambulate to toilet. Pt appropriately completing peri care.  Functional mobility for ADL. Pt appears to furniture walk.      Vision  most likely at baseline   Perception     Praxis      Pertinent Vitals/Pain Pain Assessment: Faces Pain Score: 0-No pain     Hand Dominance Right   Extremity/Trunk Assessment Upper Extremity Assessment Upper Extremity Assessment: Overall WFL for tasks assessed (using BUE functionally in ADL tasks)   Lower Extremity Assessment Lower Extremity Assessment: Defer to PT evaluation   Cervical / Trunk Assessment Cervical / Trunk Assessment: Normal   Communication Communication Communication: Expressive difficulties   Cognition Arousal/Alertness: Awake/alert Behavior During Therapy: Restless;Impulsive Overall Cognitive Status: History of cognitive impairments - at baseline     Pt laughing throughout session. Word salad at times. Pt oriented to self only.                   General Comments       Exercises       Shoulder Instructions      Home Living Family/patient expects to be discharged to:: Private  residence                                 Additional Comments: no family available to get home information. Pt apparently lives with her son who is able to provide 24/7 S. Per daughter, pt is at her baseline cognitively.      Prior Functioning/Environment Level of Independence: Needs assistance  Gait / Transfers Assistance Needed: unsure. CNA states pt ambulated with a cane per her conversation with daughter. ADL's / Homemaking Assistance Needed: assisted with bathing/dressing. Marland Kitchenable to self feed Communication / Swallowing Assistance Needed: difficulty with expressive language at basleine per daughter      OT Diagnosis: Generalized weakness;Cognitive deficits   OT Problem List:  Decreased cognition;Decreased safety awareness   OT Treatment/Interventions:      OT Goals(Current goals can be found in the care plan section) Acute Rehab OT Goals Patient Stated Goal: noe stated OT Goal Formulation: All assessment and education complete, DC therapy  OT Frequency:     Barriers to D/C:            Co-evaluation              End of Session Equipment Utilized During Treatment: Gait belt Nurse Communication: Mobility status  Activity Tolerance: Patient tolerated treatment well Patient left: in bed;with call bell/phone within reach;with bed alarm set;with nursing/sitter in room   Time: 1234-1250 OT Time Calculation (min): 16 min Charges:  OT General Charges $OT Visit: 1 Procedure OT Evaluation $Initial OT Evaluation Tier I: 1 Procedure G-Codes: OT G-codes **NOT FOR INPATIENT CLASS** Functional Assessment Tool Used: clinical judgement Functional Limitation: Self care Self Care Current Status (Z6109): At least 1 percent but less than 20 percent impaired, limited or restricted Self Care Goal Status (U0454): At least 1 percent but less than 20 percent impaired, limited or restricted Self Care Discharge Status [phone]): At least 1 percent but less than 20 percent impaired, limited or restricted  Masey Scheiber,HILLARY 04/03/2015, 12:55 PM   [facility], OTR/L  [phone] 04/03/2015

## 2015-04-03 NOTE — Progress Notes (Signed)
Pt. Being combative and refused to stay in bed, swung at sitter and security,  of Haldol given IM, had no effect, paged Dr. Benjamine Mola and received orders for restraints, pt. Is now restrained to bed with ankle and waist restraints.

## 2015-04-04 ENCOUNTER — Observation Stay (HOSPITAL_COMMUNITY): Payer: Medicare Other

## 2015-04-04 ENCOUNTER — Observation Stay (HOSPITAL_BASED_OUTPATIENT_CLINIC_OR_DEPARTMENT_OTHER): Payer: Medicare Other

## 2015-04-04 DIAGNOSIS — E785 Hyperlipidemia, unspecified: Secondary | ICD-10-CM | POA: Diagnosis not present

## 2015-04-04 DIAGNOSIS — G459 Transient cerebral ischemic attack, unspecified: Secondary | ICD-10-CM

## 2015-04-04 DIAGNOSIS — F0391 Unspecified dementia with behavioral disturbance: Secondary | ICD-10-CM | POA: Diagnosis not present

## 2015-04-04 DIAGNOSIS — I2510 Atherosclerotic heart disease of native coronary artery without angina pectoris: Secondary | ICD-10-CM | POA: Diagnosis not present

## 2015-04-04 DIAGNOSIS — I4891 Unspecified atrial fibrillation: Secondary | ICD-10-CM | POA: Diagnosis not present

## 2015-04-04 DIAGNOSIS — G451 Carotid artery syndrome (hemispheric): Secondary | ICD-10-CM | POA: Diagnosis not present

## 2015-04-04 LAB — HEMOGLOBIN A1C
HEMOGLOBIN A1C: 5.5 % (ref 4.8–5.6)
Mean Plasma Glucose: 111 mg/dL

## 2015-04-04 NOTE — Progress Notes (Signed)
STROKE TEAM PROGRESS NOTE   SUBJECTIVE (INTERVAL HISTORY) Her daughter arrived during rounds. Patient in the bed, no complaints, laughing, remains confused.   OBJECTIVE Temp:  [97.5 F (36.4 C)-99 F (37.2 C)] 97.9 F (36.6 C) (09/06 1040) Pulse Rate:  [52-60] 58 (09/06 1040) Cardiac Rhythm:  [-] Sinus bradycardia (09/06 0800) Resp:  [16-18] 18 (09/06 1040) BP: (140-180)/(60-81) 161/62 mmHg (09/06 1040) SpO2:  [98 %] 98 % (09/06 1040)  CBC:   Recent Labs Lab 04/02/15 1901 04/02/15 1902  WBC 6.3  --   NEUTROABS 3.9  --   HGB 11.1* 11.6*  HCT 33.0* 34.0*  MCV 88.2  --   PLT 225  --     Basic Metabolic Panel:   Recent Labs Lab 04/02/15 1901 04/02/15 1902  NA 135 137  K 3.6 3.6  CL 102 98*  CO2 27  --   GLUCOSE 94 91  BUN 13 14  CREATININE 1.23* 1.20*  CALCIUM 9.0  --     Lipid Panel:     Component Value Date/Time   CHOL 189 04/03/2015 0516   TRIG 40 04/03/2015 0516   HDL 56 04/03/2015 0516   CHOLHDL 3.4 04/03/2015 0516   VLDL 8 04/03/2015 0516   LDLCALC 125* 04/03/2015 0516   HgbA1c:  Lab Results  Component Value Date   HGBA1C 5.5 04/03/2015   Urine Drug Screen: No results found for: LABOPIA, COCAINSCRNUR, LABBENZ, AMPHETMU, THCU, LABBARB    IMAGING  I have personally reviewed the radiological images below and agree with the radiology interpretations.  Ct Head Wo Contrast 04/02/2015    IMPRESSION: No acute intracranial pathology.  Age-related atrophy and chronic microvascular ischemic disease.    MRI HEAD 04/04/2015  IMPRESSION:  1. Motion degraded study. 2. No acute intracranial infarct or other process identified. 3. Generalized age-related cerebral atrophy with chronic microvascular ischemic disease.   MRA HEAD  04/04/2015 IMPRESSION:  1. Motion degraded study. 2. No large or proximal arterial branch occlusion identified. Atheromatous irregularity with stenoses as above. Please note that the distal M1 segments, MCA branches, distal ACAs, and  distal PCAs not well evaluated on this exam     2D echo  Procedure narrative: Transthoracic echocardiography. Image quality was adequate. The study was technically difficult. - Left ventricle: The cavity size was normal. Wall thickness wasincreased in a pattern of moderate LVH. Systolic function wasnormal. The estimated ejection fraction was in the range of 60%to 65%. Wall motion was normal; there were no regional wallmotion abnormalities. Doppler parameters are consistent withabnormal left ventricular relaxation (grade 1 diastolicdysfunction). - Aortic valve: There was trivial regurgitation. - Mitral valve: There was mild regurgitation. - Left atrium: The atrium was mildly dilated. - Right atrium: The atrium was mildly dilated. - Pulmonary arteries: Systolic pressure was mildly increased. PApeak pressure: 35 mm Hg (S). Impressions:  No cardiac source of emboli was indentified.  Carotid Doppler  There is 1-39% bilateral ICA stenosis. Vertebral artery flow is antegrade.    PHYSICAL EXAM  General - Well nourished, well developed, in no apparent distress, very pleasant but laughing inappropriately.  Ophthalmologic - Fundi not visualized due to noncooperation.  Cardiovascular - Regular rate and rhythm with occasional premature beat.  Mental Status -  Awake, alert, very pleasant, laughing inappropriately, not answering questions apparently. Did not answer questions for orientation, only laughing. Able to repeat very simple sentences with mild to moderate dysarthria. Not able to name, but able to follow some simple commands.  Cranial Nerves II -  XII - II - blinking to visual threat bilaterally. III, IV, VI - ice attention to both directions. V - Facial sensation intact bilaterally. VII - Facial movement intact bilaterally. VIII - not cooperative on exam. X - not cooperative on exam. XI - not cooperative on exam. XII - Tongue protrusion intact.  Motor Strength - The patient's  strength was normal on spontaneous movement in all extremities.  Bulk was normal and fasciculations were absent.   Motor Tone - Muscle tone was assessed at the neck and appendages and was normal.  Reflexes - The patient's reflexes were 1+ in all extremities and she had no pathological reflexes.  Sensory - Light touch, temperature/pinprick were assessed and were symmetrical.    Coordination - not cooperative on exam.  Tremor was absent.  Gait and Station - deferred.   ASSESSMENT/PLAN [name] is a 81 y.o. female with history of hypertension, hyperlipidemia, previous stroke and dementia presenting with transient left hemiparesthesias. She did not receive IV t-PA due to rapidly resolving deficits. EKG suspicious for A. fib with RVR.  R brain TIA, embolic likely secondary to possible atrial fibrillation   Resultant transient right gaze and left-sided weakness, resolved  MRI  No acute stroke  MRA  No large vessel stenosis  Carotid Doppler  No significant stenosis   2D Echo  No source of embolus   LDL 125  HgbA1c 5.5 WNL  Lovenox 40 mg sq daily for VTE prophylaxis Diet Heart Room service appropriate?: Yes; Fluid consistency:: Thin  aspirin 81 mg orally every day prior to admission, now on aspirin 325 mg orally every day. Due to patient advanced dementia, she is not to be a good candidate for anticoagulation. Continue aspirin 325.  Ongoing aggressive stroke risk factor management  Therapy recommendations:  No therapy needs  Disposition:  Okay to return home  Atrial Fibrillation  On admitted EKGs  No known hx atrial fibrillation   Home anticoagulation:  none  CHA2DS2-VASc Score = 7, ?2 oral anticoagulation recommended  Age in Years:  ?62   +2    Sex:  Female   Female   +1    Hypertension History:  yes   +1     Diabetes Mellitus:  0   Congestive Heart Failure History:  0  Vascular Disease History:  yes   +1     Stroke/TIA/Thromboembolism History:  yes    +2  Consider anticoagulation based on test results. However, due to patient advanced dementia, she is not to be a good candidate for anticoagulation. Continue aspirin 325.  Advanced dementia with behavioral symptoms (sundowning)  On Haldol PRN  May consider seroquel for behavior disturbance  Essential Hypertension  Stable  Permissive hypertension (OK if < 220/120) but gradually normalize in 5-7 days  Hyperlipidemia  Home meds:  No statin  LDL 125, goal < 70  New lipitor 10 mg added  Continue statin at discharge  Other Stroke Risk Factors  Advanced age  Hx  - CRAO L eye (legally blind)  Coronary artery disease  Other Active Problems  Baseline dementia  Dr. Roda Shutters discussed with daughter at bedside regarding the decision without anticoagulation. Daughter expressed understanding. No neurologic follow up indicated.  Hospital day # 2  Neurology will sign off. Please call with questions. No neurology follow up indicated. Thanks for the consult.  Marvel Plan, MD PhD Stroke Neurology 04/04/2015 9:58 PM    To contact Stroke Continuity provider, please refer to [email or web address]. After hours, contact General Neurology

## 2015-04-04 NOTE — Progress Notes (Signed)
  Echocardiogram 2D Echocardiogram has been performed.  Joan Mathis 04/04/2015, 10:02 AM

## 2015-04-04 NOTE — Progress Notes (Signed)
*  PRELIMINARY RESULTS* Vascular Ultrasound Carotid Duplex (Doppler) has been completed.  Findings suggest 1-39% internal carotid artery stenosis bilaterally. Vertebral arteries are patent with antegrade flow.  04/04/2015 10:14 AM Gertie Fey, RVT, RDCS, RDMS

## 2015-04-04 NOTE — Progress Notes (Signed)
[place] TEAM 12 PROGRESS NOTE  Joan Mathis WUJ:[id number] DOB: July 11, 1926 DOA: 04/02/2015 PCP: Delorse Lek, MD  Admit HPI / Brief Narrative: 81 y.o. female with a history of hypertension, hyperlipidemia, previous stroke and dementia who was brought to the emergency room as a  code stroke after the acute onset of eye deviation to the right side and left hemiparesis. Deficits cleared in 5-10 minutes. She'd been taking aspirin 81 mg per day. CT scan of her head showed no acute intracranial abnormality.   HPI/Subjective: The patient is demented but quite happy at present.  She has however experience significant agitation over the last 24 hours.  Family at the bedside state that she is at her baseline mental status at present.  Her left hemiparesis and visual gaze preference appear to have resolved.  Assessment/Plan:  R brain TIA Felt to have been embolic secondary to atrial fibrillation - workup completed per neurology - MRI reveals no acute stroke and MRA reveals no large vessel stenosis  Newly diagnosed Paroxysmal Atrial fibrillation Not an appropriate candidate for anticoagulation secondary to advanced age with advanced dementia - presently in sinus rhythm  Essential hypertension Avoid overaggressive correction in the setting of advanced age as well as current agitation which is also low likely leading to slightly increased blood pressures  CAD, NATIVE VESSEL On Metoprolol, ASA  Hyperlipidemia LDL 125 - added Atorvastatin  Dementia  -back to baseline per daughter - complete PT/OT evaluations and attempt to qualify patient for his much home health assistance as possible  Code Status: DNR Family Communication: no family present at time of exam Disposition Plan: Awaiting full PT/OT evaluations - arrange for maximal home health assistance with probable discharge 9/7  Consultants: Neurology   Procedures: TTE - 9/6 Carotid dopplers 9/6 - 1-39% internal  carotid artery stenosis bilaterally  Antibiotics: None  DVT prophylaxis: Lovenox  Objective: Blood pressure 152/70, pulse 53, temperature 97.9 F (36.6 C), temperature source Oral, resp. rate 18, weight 71.94 kg (158 lb 9.6 oz), SpO2 97 %.  Intake/Output Summary (Last 24 hours) at 04/04/15 1549 Last data filed at 04/04/15 0800  Gross per 24 hour  Intake    480 ml  Output      0 ml  Net    480 ml   Exam: General: No acute respiratory distress - alert but demented Lungs: Clear to auscultation bilaterally without wheezes or crackles Cardiovascular: Regular rate and rhythm without murmur gallop or rub at the time of this exam Abdomen: Nontender, nondistended, soft, bowel sounds positive, no rebound, no ascites, no appreciable mass Extremities: No significant cyanosis, clubbing, or edema bilateral lower extremities  Data Reviewed: Basic Metabolic Panel:  Recent Labs Lab 04/02/15 1901 04/02/15 1902  NA 135 137  K 3.6 3.6  CL 102 98*  CO2 27  --   GLUCOSE 94 91  BUN 13 14  CREATININE 1.23* 1.20*  CALCIUM 9.0  --     CBC:  Recent Labs Lab 04/02/15 1901 04/02/15 1902  WBC 6.3  --   NEUTROABS 3.9  --   HGB 11.1* 11.6*  HCT 33.0* 34.0*  MCV 88.2  --   PLT 225  --     Liver Function Tests:  Recent Labs Lab 04/02/15 1901  AST 25  ALT 12*  ALKPHOS 73  BILITOT 0.7  PROT 5.8*  ALBUMIN 3.3*   Coags:  Recent Labs Lab 04/02/15 1901  INR 1.24    Recent Labs Lab 04/02/15 1901  APTT 36  CBG:  Recent Labs Lab 04/02/15 1917  GLUCAP 91    Studies:   Recent x-ray studies have been reviewed in detail by the Attending Physician  Scheduled Meds:  Scheduled Meds: . aspirin  325 mg Oral Daily  . atorvastatin  10 mg Oral q1800  . benazepril  20 mg Oral BID  . enoxaparin (LOVENOX) injection  40 mg Subcutaneous Daily  . metoprolol tartrate  25 mg Oral BID    Time spent on care of this patient: 25 mins   [facility] T , MD   [facility] Office  [phone] Pager - Text Page per Loretha Stapler as per below:  On-Call/Text Page:      [email or web address]      password [email or web address]  If 7PM-7AM, please contact night-coverage [email or web address] Password [facility] 04/04/2015, 3:49 PM

## 2015-04-04 NOTE — Care Management Note (Signed)
Case Management Note  Patient Details  Name: Joan Mathis MRN: [id number] Date of Birth: 10/25/1925  Subjective/Objective:                    Action/Plan: Pt admitted with TIA. Pt is from home with family. Awaiting PT recommendations. CM will cont to follow for discharge needs.   Expected Discharge Date:                  Expected Discharge Plan:  Home/Self Care  In-House Referral:     Discharge planning Services     Post Acute Care Choice:    Choice offered to:     DME Arranged:    DME Agency:     HH Arranged:    HH Agency:     Status of Service:  In process, will continue to follow  Medicare Important Message Given:    Date Medicare IM Given:    Medicare IM give by:    Date Additional Medicare IM Given:    Additional Medicare Important Message give by:     If discussed at Long Length of Stay Meetings, dates discussed:    Additional Comments:  Florene Glen, RN 04/04/2015, 11:49 AM

## 2015-04-04 NOTE — Evaluation (Signed)
Physical Therapy Evaluation Patient Details Name: Joan Mathis MRN: [id number] DOB: [birth year] Today's Date: 04/04/2015   History of Present Illness  81 y.o. female with a history of hypertension, hyperlipidemia, previous stroke and dementia who was brought to the emergency room and code stroke status after acute onset of eye deviation to the right side and left hemiparesis CT scan of her head showed no acute intracranial abnormality. NIH stroke score was 4, including disorientation, as well as difficulty following commands.  Clinical Impression  Patient demonstrates deficits in functional mobility as indicated below. May benefit from continued skilled PT to address deficits and maximize function. Will see as indicated and progress as tolerated. At this time, feel most appropriate disposition would be to return home, may benefit from trial of HHPT for balance improvements if family agreeable. Will need 24/7 supervision given baseline cognitive deficits and instability.     Follow Up Recommendations Home health PT;Supervision/Assistance - 24 hour (may trial HHPT for balance and safety)    Equipment Recommendations  None recommended by PT    Recommendations for Other Services       Precautions / Restrictions Precautions Precautions: Fall Restrictions Weight Bearing Restrictions: No      Mobility  Bed Mobility Overal bed mobility: Modified Independent                Transfers Overall transfer level: Needs assistance     Sit to Stand: Min guard Stand pivot transfers: Min assist       General transfer comment: unsteady at times  Ambulation/Gait Ambulation/Gait assistance: Min guard;Min assist Ambulation Distance (Feet): 640 Feet Assistive device: 1 person hand held assist;Straight cane Gait Pattern/deviations: Step-through pattern;Decreased stride length;Drifts right/left;Narrow base of support Gait velocity: decreased Gait velocity interpretation: Below normal speed  for age/gender General Gait Details: patient varies with stability levels, at times patient is min guard with modest instability noted, at other times patiet requires assist to maintain stability. Ambulated with and without SPC. Patient with noted LOB requiring increased physical assist when distracted.  Stairs            Wheelchair Mobility    Modified Rankin (Stroke Patients Only)       Balance   Sitting-balance support: Feet supported Sitting balance-Leahy Scale: Good     Standing balance support: During functional activity Standing balance-Leahy Scale: Fair                               Pertinent Vitals/Pain      Home Living Family/patient expects to be discharged to:: Private residence Living Arrangements: Children Available Help at Discharge: Family Type of Home: House                Prior Function Level of Independence: Needs assistance   Gait / Transfers Assistance Needed: ambulated with a cane per daughter  ADL's / Homemaking Assistance Needed: assisted with bathing/dressing. Marland Kitchenable to self feed        Hand Dominance   Dominant Hand: Right    Extremity/Trunk Assessment               Lower Extremity Assessment: Overall WFL for tasks assessed      Cervical / Trunk Assessment: Normal  Communication   Communication: Expressive difficulties  Cognition Arousal/Alertness: Awake/alert Behavior During Therapy: Restless;Impulsive Overall Cognitive Status: History of cognitive impairments - at baseline  General Comments      Exercises        Assessment/Plan    PT Assessment Patient needs continued PT services  PT Diagnosis Difficulty walking;Abnormality of gait   PT Problem List Decreased strength;Decreased activity tolerance;Decreased balance;Decreased mobility;Decreased coordination;Decreased cognition;Decreased safety awareness  PT Treatment Interventions DME instruction;Gait  training;Functional mobility training;Therapeutic activities;Therapeutic exercise;Balance training;Cognitive remediation;Patient/family education   PT Goals (Current goals can be found in the Care Plan section) Acute Rehab PT Goals Patient Stated Goal: none stated PT Goal Formulation: With patient/family Time For Goal Achievement: 04/18/15 Potential to Achieve Goals: Fair    Frequency Min 3X/week   Barriers to discharge        Co-evaluation               End of Session Equipment Utilized During Treatment: Gait belt Activity Tolerance: Patient tolerated treatment well Patient left: in bed;with call bell/phone within reach;with bed alarm set Nurse Communication: Mobility status         Time: 1610-9604 PT Time Calculation (min) (ACUTE ONLY): 17 min   Charges:   PT Evaluation $Initial PT Evaluation Tier I: 1 Procedure     PT G CodesFabio Asa 05-01-2015, 3:53 PM Charlotte Crumb, PT DPT  [phone]

## 2015-04-04 NOTE — Progress Notes (Signed)
Daughter reported to this nurse that pt pulled out IV to right arm. Noted IV catheter attached on her table. Pt unaware that she even pulled out. No bleeding noted. Pt denies pain.  Daughter at bedside. Will continue to monitor.

## 2015-04-05 ENCOUNTER — Encounter (HOSPITAL_COMMUNITY): Payer: Self-pay | Admitting: Emergency Medicine

## 2015-04-05 DIAGNOSIS — G454 Transient global amnesia: Secondary | ICD-10-CM | POA: Diagnosis not present

## 2015-04-05 DIAGNOSIS — G459 Transient cerebral ischemic attack, unspecified: Secondary | ICD-10-CM | POA: Diagnosis not present

## 2015-04-05 MED ORDER — ATORVASTATIN CALCIUM 10 MG PO TABS: 10.0000 mg | ORAL_TABLET | Freq: Every day | ORAL | Status: DC

## 2015-04-05 MED ORDER — ASPIRIN 81 MG PO TBEC: 325.0000 mg | DELAYED_RELEASE_TABLET | Freq: Every day | ORAL | Status: DC

## 2015-04-05 NOTE — Discharge Summary (Signed)
Physician Discharge Summary  Joan Mathis YNW:[id number] DOB: January 05, 1926 DOA: 04/02/2015  PCP: Delorse Lek, MD  Admit date: 04/02/2015 Discharge date: 04/05/2015  Recommendations for Outpatient Follow-up:  1. Pt will need to follow up with PCP in 2-3 weeks post discharge 2. Please obtain BMP to evaluate electrolytes and kidney function 3. Please also check CBC to evaluate Hg and Hct levels 4. Pt made aware she is on Lisinopril and that her Cr is ~ 1.2, she was advised to discuss with PCP if Cr gets worse, may need to hold this medication temporary 5. Pt also made aware to stop NSAID's use until renal function stabilizes   Discharge Diagnoses:  Principal Problem:   TIA (transient ischemic attack) Active Problems:   Essential hypertension, benign   CAD, NATIVE VESSEL   Hyperlipidemia   Dementia without behavioral disturbance   Dementia with behavioral disturbance   Atrial fibrillation with RVR  Discharge Condition: Stable  Diet recommendation: Heart healthy diet discussed in details   History of present illness:  81 y.o. female with a history of hypertension, hyperlipidemia, previous stroke and dementia who was brought to the emergency room as a code stroke after the acute onset of eye deviation to the right side and left hemiparesis. Deficits cleared in 5-10 minutes. She'd been taking aspirin 81 mg per day. CT scan of her head showed no acute intracranial abnormality.    Assessment/Plan: R brain TIA Felt to have been embolic secondary to atrial fibrillation - workup completed per neurology  - MRI reveals no acute stroke and MRA reveals no large vessel stenosis - pt wants to go home   Newly diagnosed Paroxysmal Atrial fibrillation - Not an appropriate candidate for anticoagulation secondary to advanced age with advanced dementia  - presently in sinus rhythm  Essential hypertension - reasonable inpatient control   CAD, NATIVE VESSEL - On Metoprolol,  ASA  Hyperlipidemia - LDL 125  - added Atorvastatin upon discharge   Dementia  - back to baseline per daughter   Code Status: DNR  Consultants: Neurology   Procedures: TTE - 9/6 Carotid dopplers 9/6 - 1-39% internal carotid artery stenosis bilaterally  Antibiotics: None  Discharge Exam: Filed Vitals:   04/05/15 0540  BP: 148/60  Pulse: 57  Temp: 97.5 F (36.4 C)  Resp: 18   Filed Vitals:   04/04/15 1743 04/04/15 2140 04/05/15 0148 04/05/15 0540  BP: 165/70 148/69 184/83 148/60  Pulse: 54 59 53 57  Temp: 99 F (37.2 C) 99 F (37.2 C) 97.9 F (36.6 C) 97.5 F (36.4 C)  TempSrc: Oral Oral Oral Oral  Resp: Weight:      SpO2: 98% 95% 95% 96%    General: Pt is alert, follows commands appropriately, not in acute distress Cardiovascular: Regular rate and rhythm, no rubs, no gallops Respiratory: Clear to auscultation bilaterally, no wheezing, no crackles, no rhonchi Abdominal: Soft, non tender, non distended, bowel sounds +, no guarding   Discharge Instructions    Medication List    STOP taking these medications        ibuprofen 200 MG tablet  Commonly known as:  ADVIL,MOTRIN      TAKE these medications        aspirin 81 MG EC tablet  Take 4 tablets (325 mg total) by mouth at bedtime. Swallow whole.     atorvastatin 10 MG tablet  Commonly known as:  LIPITOR  Take 1 tablet (10 mg total) by mouth daily at 6 PM.  benazepril 20 MG tablet  Commonly known as:  LOTENSIN  Take 20 mg by mouth 2 (two) times daily.     metoprolol tartrate 25 MG tablet  Commonly known as:  LOPRESSOR  Take 25 mg by mouth 2 (two) times daily.          Medication List    STOP taking these medications        ibuprofen 200 MG tablet  Commonly known as:  ADVIL,MOTRIN      TAKE these medications        aspirin 81 MG EC tablet  Take 4 tablets (325 mg total) by mouth at bedtime. Swallow whole.     atorvastatin 10 MG tablet  Commonly known as:  LIPITOR   Take 1 tablet (10 mg total) by mouth daily at 6 PM.     benazepril 20 MG tablet  Commonly known as:  LOTENSIN  Take 20 mg by mouth 2 (two) times daily.     metoprolol tartrate 25 MG tablet  Commonly known as:  LOPRESSOR  Take 25 mg by mouth 2 (two) times daily.           Follow-up Information    Follow up with Delorse Lek, MD.   Specialty:  Family Medicine   Contact information:   [address] [place] [address] [phone]        The results of significant diagnostics from this hospitalization (including imaging, microbiology, ancillary and laboratory) are listed below for reference.     Microbiology: No results found for this or any previous visit (from the past 240 hour(s)).   Labs: Basic Metabolic Panel:  Recent Labs Lab 04/02/15 1901 04/02/15 1902  NA 135 137  K 3.6 3.6  CL 102 98*  CO2 27  --   GLUCOSE 94 91  BUN 13 14  CREATININE 1.23* 1.20*  CALCIUM 9.0  --    Liver Function Tests:  Recent Labs Lab 04/02/15 1901  AST 25  ALT 12*  ALKPHOS 73  BILITOT 0.7  PROT 5.8*  ALBUMIN 3.3*   No results for input(s): LIPASE, AMYLASE in the last 168 hours. No results for input(s): AMMONIA in the last 168 hours. CBC:  Recent Labs Lab 04/02/15 1901 04/02/15 1902  WBC 6.3  --   NEUTROABS 3.9  --   HGB 11.1* 11.6*  HCT 33.0* 34.0*  MCV 88.2  --   PLT 225  --     CBG:  Recent Labs Lab 04/02/15 1917  GLUCAP 91     SIGNED: Time coordinating discharge: 30 minutes  MAGICK-MYERS, ISKRA, MD  [facility] 04/05/2015, 9:00 AM Pager [phone]  If 7PM-7AM, please contact night-coverage [email or web address] Password [email or web address]

## 2015-04-05 NOTE — Discharge Instructions (Signed)
Aspirin and Your Heart Aspirin affects the way your blood clots and helps "thin" the blood. Aspirin has many uses in heart disease. It may be used as a primary prevention to help reduce the risk of heart related events. It also can be used as a secondary measure to prevent more heart attacks or to prevent additional damage from blood clots.  ASPIRIN MAY HELP IF YOU:  Have had a heart attack or chest pain.  Have undergone open heart surgery such as CABG (Coronary Artery Bypass Surgery).  Have had coronary angioplasty with or without stents.  Have experienced a stroke or TIA (transient ischemic attack).  Have peripheral vascular disease (PAD).  Have chronic heart rhythm problems such as atrial fibrillation.  Are at risk for heart disease. BEFORE STARTING ASPIRIN Before you start taking aspirin, your caregiver will need to review your medical history. Many things will need to be taken into consideration, such as:  Smoking status.  Blood pressure.  Diabetes.  Gender.  Weight.  Cholesterol level. ASPIRIN DOSES  Aspirin should only be taken on the advice of your caregiver. Talk to your caregiver about how much aspirin you should take. Aspirin comes in different doses such as:  81 mg.  162 mg.  325 mg.  The aspirin dose you take may be affected by many factors, some of which include:  Your current medications, especially if your are taking blood-thinners or anti-platelet medicine.  Liver function.  Heart disease risk.  Age.  Aspirin comes in two forms:  Non-enteric-coated. This type of aspirin does not have a coating and is absorbed faster. Non-enteric coated aspirin is recommended for patients experiencing chest pain symptoms. This type of aspirin also comes in a chewable form.  Enteric-coated. This means the aspirin has a special coating that releases the medicine very slowly. Enteric-coated aspirin causes less stomach upset. This type of aspirin should not be chewed  or crushed. ASPIRIN SIDE EFFECTS Daily use of aspirin can increase your risk of serious side effects. Some of these include:  Increased bleeding. This can range from a cut that does not stop bleeding to more serious problems such as stomach bleeding or bleeding into the brain (Intracerebral bleeding).  Increased bruising.  Stomach upset.  An allergic reaction such as red, itchy skin.  Increased risk of bleeding when combined with non-steroidal anti-inflammatory medicine (NSAIDS).  Alcohol should be drank in moderation when taking aspirin. Alcohol can increase the risk of stomach bleeding when taken with aspirin.  Aspirin should not be given to children less than 18 years of age due to the association of Reye syndrome. Reye syndrome is a serious illness that can affect the brain and liver. Studies have linked Reye syndrome with aspirin use in children.  People that have nasal polyps have an increased risk of developing an aspirin allergy. SEEK MEDICAL CARE IF:   You develop an allergic reaction such as:  Hives.  Itchy skin.  Swelling of the lips, tongue or face.  You develop stomach pain.  You have unusual bleeding or bruising.  You have ringing in your ears. SEEK IMMEDIATE MEDICAL CARE IF:   You have severe chest pain, especially if the pain is crushing or pressure-like and spreads to the arms, back, neck, or jaw. THIS IS AN EMERGENCY. Do not wait to see if the pain will go away. Get medical help at once. Call your local emergency services (911 in the [place] DO NOT drive yourself to the hospital.  You have stroke-like symptoms   such as:  Loss of vision.  Difficulty talking.  Numbness or weakness on one side of your body.  Numbness or weakness in your arm or leg.  Not thinking clearly or feeling confused.  Your bowel movements are bloody, dark red or black in color.  You vomit or cough up blood.  You have blood in your urine.  You have shortness of breath,  coughing or wheezing. MAKE SURE YOU:   Understand these instructions.  Will monitor your condition.  Seek immediate medical care if necessary. Document Released: 06/27/2008 Document Revised: 11/09/2012 Document Reviewed: 10/20/2013 ExitCare Patient Information 2015 [employer]. This information is not intended to replace advice given to you by your health care provider. Make sure you discuss any questions you have with your health care provider.  

## 2015-04-05 NOTE — Progress Notes (Signed)
Pt discharged home with home health. Left unit via wheelchair at 1320. To be transported home via car by daughter. Lawson Radar

## 2015-04-05 NOTE — Care Management Note (Signed)
Case Management Note  Patient Details  Name: Joan Mathis MRN: [id number] Date of Birth: [birth year]  Subjective/Objective:                    Action/Plan: CM met with patient and daughter Raquel Sarna ([phone]) at the bedside to arrange Home Health. The daughter was given [facility] list and she chose [facility]. [facility] notified of RN/ PT and Aide orders and they accepted the referral. Requested clinicals and orders faxed to [facility].   Expected Discharge Date:                  Expected Discharge Plan:  [place]  In-House Referral:     Discharge planning Services  CM Consult  Post Acute Care Choice:    Choice offered to:  Adult Children  DME Arranged:    DME Agency:     HH Arranged:  RN, PT, Nurse's Aide [place] Agency:  [place]  Status of Service:  Completed, signed off  Medicare Important Message Given:    Date Medicare IM Given:    Medicare IM give by:    Date Additional Medicare IM Given:    Additional Medicare Important Message give by:     If discussed at [place] of Stay Meetings, dates discussed:    Additional Comments:  Ollen Gross, RN 04/05/2015, 11:00 AM

## 2015-06-26 ENCOUNTER — Emergency Department (HOSPITAL_COMMUNITY): Payer: Medicare Other

## 2015-06-26 ENCOUNTER — Encounter (HOSPITAL_COMMUNITY): Payer: Self-pay | Admitting: *Deleted

## 2015-06-26 ENCOUNTER — Emergency Department (HOSPITAL_COMMUNITY)
Admission: EM | Admit: 2015-06-26 | Discharge: 2015-06-27 | Disposition: A | Discharge status: Home / Self Care | Payer: Medicare Other | Attending: Emergency Medicine | Admitting: Emergency Medicine

## 2015-06-26 DIAGNOSIS — I10 Essential (primary) hypertension: Secondary | ICD-10-CM | POA: Diagnosis not present

## 2015-06-26 DIAGNOSIS — F0390 Unspecified dementia without behavioral disturbance: Secondary | ICD-10-CM | POA: Insufficient documentation

## 2015-06-26 DIAGNOSIS — H5442 Blindness, left eye, normal vision right eye: Secondary | ICD-10-CM | POA: Diagnosis not present

## 2015-06-26 DIAGNOSIS — Z7982 Long term (current) use of aspirin: Secondary | ICD-10-CM | POA: Diagnosis not present

## 2015-06-26 DIAGNOSIS — R4182 Altered mental status, unspecified: Secondary | ICD-10-CM | POA: Diagnosis not present

## 2015-06-26 DIAGNOSIS — K219 Gastro-esophageal reflux disease without esophagitis: Secondary | ICD-10-CM | POA: Insufficient documentation

## 2015-06-26 DIAGNOSIS — Z8673 Personal history of transient ischemic attack (TIA), and cerebral infarction without residual deficits: Secondary | ICD-10-CM | POA: Insufficient documentation

## 2015-06-26 DIAGNOSIS — R55 Syncope and collapse: Secondary | ICD-10-CM | POA: Diagnosis present

## 2015-06-26 LAB — BASIC METABOLIC PANEL
ANION GAP: 8 (ref 5–15)
BUN: 14 mg/dL (ref 6–20)
CALCIUM: 8.7 mg/dL — AB (ref 8.9–10.3)
CO2: 26 mmol/L (ref 22–32)
Chloride: 96 mmol/L — ABNORMAL LOW (ref 101–111)
Creatinine, Ser: 1.1 mg/dL — ABNORMAL HIGH (ref 0.44–1.00)
GFR, EST AFRICAN AMERICAN: 51 mL/min — AB (ref >60)
GFR, EST NON AFRICAN AMERICAN: 44 mL/min — AB (ref >60)
Glucose, Bld: 116 mg/dL — ABNORMAL HIGH (ref 65–99)
Potassium: 4 mmol/L (ref 3.5–5.1)
Sodium: 130 mmol/L — ABNORMAL LOW (ref 135–145)

## 2015-06-26 LAB — CBC
HCT: 34.5 % — ABNORMAL LOW (ref 36.0–46.0)
HEMOGLOBIN: 11.3 g/dL — AB (ref 12.0–15.0)
MCH: 29.4 pg (ref 26.0–34.0)
MCHC: 32.8 g/dL (ref 30.0–36.0)
MCV: 89.6 fL (ref 78.0–100.0)
PLATELETS: 238 10*3/uL (ref 150–400)
RBC: 3.85 MIL/uL — AB (ref 3.87–5.11)
RDW: 12.6 % (ref 11.5–15.5)
WBC: 5.5 10*3/uL (ref 4.0–10.5)

## 2015-06-26 LAB — I-STAT TROPONIN, ED: TROPONIN I, POC: 0 ng/mL (ref 0.00–0.08)

## 2015-06-26 NOTE — ED Notes (Signed)
Family at bedside. 

## 2015-06-26 NOTE — ED Notes (Signed)
Patient transported to X-ray 

## 2015-06-26 NOTE — ED Provider Notes (Signed)
CSN: [id number]     Arrival date & time 06/26/15  1820 History   First MD Initiated Contact with Patient 06/26/15 1837     Chief Complaint  Patient presents with  . Near Syncope     (Consider location/radiation/quality/duration/timing/severity/associated sxs/prior Treatment) HPI Comments: [name] with history of HTN, dementia, CAD presents for an episode of change in mental status.  The patient was reportedly sitting at the table when she started behaving weirdly and was not making sense when she was talking and she then seemed to get very weak and her son had to stand behind her to hold her up.  They report a similar episode when she was diagnosed with a TIA although at that time she seemed to have weakness on one side and they did not notice any focal weakness at this time.  The patient now appears to be back at her baseline per family.  She denies any complaints but is demented at baseline.  Patient had seemed herself earlier in the day other than complaining about feeling hot at one point.   Past Medical History  Diagnosis Date  . Atherosclerotic cardiovascular disease   . Hypertension   . Dementia   . FH: dementia   . Acid reflux   . Hypercholesteremia   . Blindness     legal blindness,left eye,histor  . H/O: stroke     ocular stroke,   . Postmenopausal   . Trochanteric bursitis    History reviewed. No pertinent past surgical history. No family history on file. Social History  Substance Use Topics  . Smoking status: Never Smoker   . Smokeless tobacco: None  . Alcohol Use: No   OB History    No data available     Review of Systems  Unable to perform ROS: Dementia      Allergies  Review of patient's allergies indicates no known allergies.  Home Medications   Prior to Admission medications   Medication Sig Start Date End Date Taking? Authorizing Provider  aspirin 81 MG EC tablet Take 4 tablets (325 mg total) by mouth at bedtime. Swallow whole. Patient  taking differently: Take 81 mg by mouth at bedtime. Swallow whole. 04/05/15  Yes Dorothea Ogle, MD  atorvastatin (LIPITOR) 10 MG tablet Take 1 tablet (10 mg total) by mouth daily at 6 PM. 04/05/15  Yes Dorothea Ogle, MD  benazepril (LOTENSIN) 20 MG tablet Take 20 mg by mouth daily.    Yes Historical Provider, MD  haloperidol (HALDOL) 0.5 MG tablet Take 0.5 mg by mouth daily.   Yes Historical Provider, MD  metoprolol tartrate (LOPRESSOR) 25 MG tablet Take 25 mg by mouth daily.    Yes Historical Provider, MD  nitroGLYCERIN (NITROSTAT) 0.4 MG SL tablet Place 0.4 mg under the tongue every 5 (five) minutes as needed for chest pain.   Yes Historical Provider, MD   BP 117/86 mmHg  Pulse 75  Temp(Src) 97.9 F (36.6 C) (Oral)  Resp 21  SpO2 95% Physical Exam  Constitutional: No distress.  HENT:  Head: Normocephalic and atraumatic.  Right Ear: External ear normal.  Left Ear: External ear normal.  Mouth/Throat: Oropharynx is clear and moist. No oropharyngeal exudate.  Eyes: EOM are normal. Pupils are equal, round, and reactive to light.  Neck: Normal range of motion. Neck supple.  Cardiovascular: Normal rate, regular rhythm, normal heart sounds and intact distal pulses.   No murmur heard. Pulmonary/Chest: Effort normal. No respiratory distress. She has no wheezes.  She has no rales.  Abdominal: Soft. She exhibits no distension and no mass. There is no tenderness. There is no rebound and no guarding.  Musculoskeletal: She exhibits no edema or tenderness.  Neurological: She is alert. No cranial nerve deficit. Coordination (normal finger to nose on examination) normal.  Oriented to person only  Skin: Skin is warm and dry. No rash noted. She is not diaphoretic.  Vitals reviewed.   ED Course  Procedures (including critical care time) Labs Review Labs Reviewed  BASIC METABOLIC PANEL - Abnormal; Notable for the following:    Sodium 130 (*)    Chloride 96 (*)    Glucose, Bld 116 (*)    Creatinine,  Ser 1.10 (*)    Calcium 8.7 (*)    GFR calc non Af Amer 44 (*)    GFR calc Af Amer 51 (*)    All other components within normal limits  CBC - Abnormal; Notable for the following:    RBC 3.85 (*)    Hemoglobin 11.3 (*)    HCT 34.5 (*)    All other components within normal limits  URINALYSIS, ROUTINE W REFLEX MICROSCOPIC (NOT AT [facility]) - Abnormal; Notable for the following:    APPearance CLOUDY (*)    Hgb urine dipstick TRACE (*)    Leukocytes, UA MODERATE (*)    All other components within normal limits  URINE MICROSCOPIC-ADD ON - Abnormal; Notable for the following:    Squamous Epithelial / LPF 6-30 (*)    Bacteria, UA MANY (*)    All other components within normal limits  URINE CULTURE  I-STAT TROPOININ, ED  I-STAT TROPOININ, ED  Rosezena Sensor, ED    Imaging Review Dg Chest 2 View  06/26/2015  CLINICAL DATA:  family states pt was sitting at the bar talking and all of a sudden "talking out of her head", another family member states she slumped over. Pt was alert when EMS arrived, denies pain, hx: dementia, alzheimer's. EXAM: CHEST  2 VIEW COMPARISON:  03/08/2011 FINDINGS: Hyperinflation. Accentuation of expected thoracic kyphosis. Lateral view degraded by patient arm position. Midline trachea. Mild cardiomegaly with a tortuous thoracic aorta. No pleural effusion or pneumothorax. No congestive failure. Mild scarring at the left lung base. IMPRESSION: No acute cardiopulmonary disease. Cardiomegaly with a tortuous thoracic aorta. Electronically Signed   By: Jeronimo Greaves M.D.   On: 06/26/2015 19:43   Ct Head Wo Contrast  06/26/2015  CLINICAL DATA:  81 year old female with acute altered mental status and confusion. History of dementia. EXAM: CT HEAD WITHOUT CONTRAST TECHNIQUE: Contiguous axial images were obtained from the base of the skull through the vertex without intravenous contrast. COMPARISON:  04/02/2015 head CT FINDINGS: Moderate generalized cerebral volume loss and chronic  small-vessel white matter ischemic changes are again noted. No acute intracranial abnormalities are identified, including mass lesion or mass effect, hydrocephalus, extra-axial fluid collection, midline shift, hemorrhage, or acute infarction. The visualized bony calvarium is unremarkable. IMPRESSION: No evidence of acute intracranial abnormality. Atrophy and chronic small-vessel white matter ischemic changes. Electronically Signed   By: Harmon Pier M.D.   On: 06/26/2015 20:09   Mr Brain Wo Contrast  06/27/2015  CLINICAL DATA:  Initial evaluation for acute altered mental status. EXAM: MRI HEAD WITHOUT CONTRAST TECHNIQUE: Multiplanar, multiecho pulse sequences of the brain and surrounding structures were obtained without intravenous contrast. COMPARISON:  Prior CT from earlier the same day as well as previous MRI from 04/04/2015. FINDINGS: Study degraded by motion artifact para Diffuse  prominence of the CSF containing spaces is compatible with generalized age-related cerebral atrophy. Patchy and confluent T2/FLAIR hyperintensity within the periventricular white matter most consistent with chronic small vessel ischemic disease, mild for patient age. No abnormal foci of restricted diffusion to suggest acute intracranial infarct. Normal intravascular flow voids are preserved. No acute or chronic intracranial hemorrhage. No mass lesion, midline shift, or mass effect. No hydrocephalus. No extra-axial fluid collection. Craniocervical junction within normal limits. No acute abnormality about the pituitary gland. Orbits demonstrate no acute abnormality. Sequela of prior lens extraction seen on the right. Mild scattered mucosal thickening within the ethmoidal air cells. Paranasal sinuses are otherwise clear. No significant mastoid effusion. Inner ear structures grossly normal. Bone marrow signal intensity within normal limits. No scalp soft tissue abnormality. IMPRESSION: 1. Motion degraded study. 2. No acute intracranial  infarct or other process identified. 3. Age-related cerebral atrophy with chronic small vessel ischemic disease Electronically Signed   By: Rise MuBenjamin  McClintock M.D.   On: 06/27/2015 01:45   I have personally reviewed and evaluated these images and lab results as part of my medical decision-making.   EKG Interpretation None      MDM  Patient seen and evaluated in stable condition.  Patient at her baseline per family at bedside throughout extensive EC stay.  Discussed clinical presentation and previous TIA with neurologist on cal who agreed with plan for MRI and discharge if normal as she just underwent work up in September 2016 with carotid doppler and echo.  UA concerning for possible infection and patient treated with fosfomycin.  EKG unremarkable.  Initial troponin normal.  No sign of acute pathology. MRI unremarkable.  Discussed results and plan of care with patient's son who expressed understanding and agreement with plan for discharge if second troponin normal.  Patient signed out to night team pending repeat troponin results.  She is to follow up with her PCP. Final diagnoses:  Altered mental status, unspecified altered mental status type    1. Episode of weakness/change in mental status    Leta BaptistEmily Roe Nguyen, MD 06/27/15 [phone]

## 2015-06-26 NOTE — ED Notes (Signed)
Pt presents via [facility] from home, family states pt was sitting at the bar talking and all of a sudden "talking out of her head", another family member states she slumped over.  Pt was alert when EMS arrived, denies pain, hx: dementia, alzheimer's.  Per EMS pt stated metoprolol and Lipitor within the last 6 weeks, also started haldol in the last 6 months.  BP-160/84 P-76 O2-100% RA.  CBG-161.

## 2015-06-27 ENCOUNTER — Emergency Department (HOSPITAL_COMMUNITY): Payer: Medicare Other

## 2015-06-27 DIAGNOSIS — R4182 Altered mental status, unspecified: Secondary | ICD-10-CM | POA: Diagnosis not present

## 2015-06-27 LAB — URINALYSIS, ROUTINE W REFLEX MICROSCOPIC
Bilirubin Urine: NEGATIVE
Glucose, UA: NEGATIVE mg/dL
KETONES UR: NEGATIVE mg/dL
NITRITE: NEGATIVE
PROTEIN: NEGATIVE mg/dL
Specific Gravity, Urine: 1.011 (ref 1.005–1.030)
pH: 7 (ref 5.0–8.0)

## 2015-06-27 LAB — URINE MICROSCOPIC-ADD ON

## 2015-06-27 LAB — I-STAT TROPONIN, ED
TROPONIN I, POC: 0.02 ng/mL (ref 0.00–0.08)
Troponin i, poc: 0.01 ng/mL (ref 0.00–0.08)

## 2015-06-27 MED ORDER — FOSFOMYCIN TROMETHAMINE 3 G PO PACK
3.0000 g | PACK | Freq: Once | ORAL | Status: AC
Start: 2015-06-27 — End: 2015-06-27
  Administered 2015-06-27: 3 g via ORAL
  Filled 2015-06-27: qty 3

## 2015-06-27 MED ORDER — CEPHALEXIN 250 MG PO CAPS: 500.0000 mg | ORAL_CAPSULE | Freq: Once | ORAL | Status: DC

## 2015-06-27 NOTE — ED Provider Notes (Signed)
Patient signed out to me by Dr.Nguyen pending repeat troponin which has come back normal. Patient has been resting comfortably and in no distress. She has good outpatient follow-up arranged. Patient is discharged.  Dione Boozeavid Alexio Sroka, MD 06/27/15 [phone]

## 2015-06-27 NOTE — Discharge Instructions (Signed)
You were seen tonight for your episode of weakness.  You need to follow up outpatient with your primary care physician for reevaluation to make sure that you are still feeling okay.  Your urine shows some signs of infection and you were given a dose of antibiotics that should treat a UTI and does not need further antibiotic treatment after the single dose.  Return with sudden worsening of symptoms.  Weakness Weakness is a lack of strength. It may be felt all over the body (generalized) or in one specific part of the body (focal). Some causes of weakness can be serious. You may need further medical evaluation, especially if you are elderly or you have a history of immunosuppression (such as chemotherapy or HIV), kidney disease, heart disease, or diabetes. CAUSES  Weakness can be caused by many different things, including:  Infection.  Physical exhaustion.  Internal bleeding or other blood loss that results in a lack of red blood cells (anemia).  Dehydration. This cause is more common in elderly people.  Side effects or electrolyte abnormalities from medicines, such as pain medicines or sedatives.  Emotional distress, anxiety, or depression.  Circulation problems, especially severe peripheral arterial disease.  Heart disease, such as rapid atrial fibrillation, bradycardia, or heart failure.  Nervous system disorders, such as Guillain-Barr syndrome, multiple sclerosis, or stroke. DIAGNOSIS  To find the cause of your weakness, your caregiver will take your history and perform a physical exam. Lab tests or X-rays may also be ordered, if needed. TREATMENT  Treatment of weakness depends on the cause of your symptoms and can vary greatly. HOME CARE INSTRUCTIONS   Rest as needed.  Eat a well-balanced diet.  Try to get some exercise every day.  Only take over-the-counter or prescription medicines as directed by your caregiver. SEEK MEDICAL CARE IF:   Your weakness seems to be getting  worse or spreads to other parts of your body.  You develop new aches or pains. SEEK IMMEDIATE MEDICAL CARE IF:   You cannot perform your normal daily activities, such as getting dressed and feeding yourself.  You cannot walk up and down stairs, or you feel exhausted when you do so.  You have shortness of breath or chest pain.  You have difficulty moving parts of your body.  You have weakness in only one area of the body or on only one side of the body.  You have a fever.  You have trouble speaking or swallowing.  You cannot control your bladder or bowel movements.  You have black or bloody vomit or stools. MAKE SURE YOU:  Understand these instructions.  Will watch your condition.  Will get help right away if you are not doing well or get worse.   This information is not intended to replace advice given to you by your health care provider. Make sure you discuss any questions you have with your health care provider.   Document Released: 07/15/2005 Document Revised: 01/14/2012 Document Reviewed: 09/13/2011 Elsevier Interactive Patient Education [employer]2016 Elsevier Inc.

## 2015-06-27 NOTE — ED Notes (Signed)
Patient transported to MRI 

## 2015-06-28 LAB — URINE CULTURE

## 2015-10-03 ENCOUNTER — Emergency Department (HOSPITAL_BASED_OUTPATIENT_CLINIC_OR_DEPARTMENT_OTHER): Payer: Medicare Other

## 2015-10-03 ENCOUNTER — Encounter (HOSPITAL_BASED_OUTPATIENT_CLINIC_OR_DEPARTMENT_OTHER): Payer: Self-pay

## 2015-10-03 ENCOUNTER — Emergency Department (HOSPITAL_BASED_OUTPATIENT_CLINIC_OR_DEPARTMENT_OTHER)
Admission: EM | Admit: 2015-10-03 | Discharge: 2015-10-03 | Disposition: A | Discharge status: Home / Self Care | Payer: Medicare Other | Attending: Emergency Medicine | Admitting: Emergency Medicine

## 2015-10-03 DIAGNOSIS — Y9389 Activity, other specified: Secondary | ICD-10-CM | POA: Diagnosis not present

## 2015-10-03 DIAGNOSIS — I2510 Atherosclerotic heart disease of native coronary artery without angina pectoris: Secondary | ICD-10-CM | POA: Insufficient documentation

## 2015-10-03 DIAGNOSIS — I10 Essential (primary) hypertension: Secondary | ICD-10-CM | POA: Diagnosis not present

## 2015-10-03 DIAGNOSIS — W1839XA Other fall on same level, initial encounter: Secondary | ICD-10-CM | POA: Insufficient documentation

## 2015-10-03 DIAGNOSIS — H540 Blindness, both eyes: Secondary | ICD-10-CM | POA: Insufficient documentation

## 2015-10-03 DIAGNOSIS — Z8673 Personal history of transient ischemic attack (TIA), and cerebral infarction without residual deficits: Secondary | ICD-10-CM | POA: Insufficient documentation

## 2015-10-03 DIAGNOSIS — F0390 Unspecified dementia without behavioral disturbance: Secondary | ICD-10-CM | POA: Insufficient documentation

## 2015-10-03 DIAGNOSIS — S4991XA Unspecified injury of right shoulder and upper arm, initial encounter: Secondary | ICD-10-CM | POA: Diagnosis not present

## 2015-10-03 DIAGNOSIS — Z8719 Personal history of other diseases of the digestive system: Secondary | ICD-10-CM | POA: Insufficient documentation

## 2015-10-03 DIAGNOSIS — E7800 Pure hypercholesterolemia, unspecified: Secondary | ICD-10-CM | POA: Diagnosis not present

## 2015-10-03 DIAGNOSIS — Z8739 Personal history of other diseases of the musculoskeletal system and connective tissue: Secondary | ICD-10-CM | POA: Insufficient documentation

## 2015-10-03 DIAGNOSIS — Z79899 Other long term (current) drug therapy: Secondary | ICD-10-CM | POA: Diagnosis not present

## 2015-10-03 DIAGNOSIS — Y998 Other external cause status: Secondary | ICD-10-CM | POA: Insufficient documentation

## 2015-10-03 DIAGNOSIS — Y9289 Other specified places as the place of occurrence of the external cause: Secondary | ICD-10-CM | POA: Diagnosis not present

## 2015-10-03 DIAGNOSIS — M25511 Pain in right shoulder: Secondary | ICD-10-CM

## 2015-10-03 DIAGNOSIS — Z7982 Long term (current) use of aspirin: Secondary | ICD-10-CM | POA: Insufficient documentation

## 2015-10-03 LAB — BASIC METABOLIC PANEL
Anion gap: 9 (ref 5–15)
BUN: 17 mg/dL (ref 6–20)
CHLORIDE: 102 mmol/L (ref 101–111)
CO2: 27 mmol/L (ref 22–32)
CREATININE: 0.92 mg/dL (ref 0.44–1.00)
Calcium: 8.9 mg/dL (ref 8.9–10.3)
GFR calc Af Amer: >60 mL/min (ref >60)
GFR calc non Af Amer: 54 mL/min — ABNORMAL LOW (ref >60)
GLUCOSE: 89 mg/dL (ref 65–99)
POTASSIUM: 3.8 mmol/L (ref 3.5–5.1)
Sodium: 138 mmol/L (ref 135–145)

## 2015-10-03 LAB — CBC WITH DIFFERENTIAL/PLATELET
Basophils Absolute: 0 10*3/uL (ref 0.0–0.1)
Basophils Relative: 0 %
EOS ABS: 0.1 10*3/uL (ref 0.0–0.7)
Eosinophils Relative: 2 %
HCT: 35.6 % — ABNORMAL LOW (ref 36.0–46.0)
HEMOGLOBIN: 11.6 g/dL — AB (ref 12.0–15.0)
LYMPHS ABS: 1.6 10*3/uL (ref 0.7–4.0)
Lymphocytes Relative: 23 %
MCH: 28.2 pg (ref 26.0–34.0)
MCHC: 32.6 g/dL (ref 30.0–36.0)
MCV: 86.6 fL (ref 78.0–100.0)
MONO ABS: 0.9 10*3/uL (ref 0.1–1.0)
MONOS PCT: 13 %
NEUTROS PCT: 62 %
Neutro Abs: 4.3 10*3/uL (ref 1.7–7.7)
Platelets: 265 10*3/uL (ref 150–400)
RBC: 4.11 MIL/uL (ref 3.87–5.11)
RDW: 13.8 % (ref 11.5–15.5)
WBC: 7 10*3/uL (ref 4.0–10.5)

## 2015-10-03 LAB — C-REACTIVE PROTEIN: CRP: 0.6 mg/dL (ref <1.0)

## 2015-10-03 LAB — SEDIMENTATION RATE: Sed Rate: 51 mm/hr — ABNORMAL HIGH (ref 0–22)

## 2015-10-03 MED ORDER — METOPROLOL TARTRATE 50 MG PO TABS
25.0000 mg | ORAL_TABLET | Freq: Once | ORAL | Status: AC
  Administered 2015-10-03: 25 mg via ORAL
  Filled 2015-10-03: qty 1

## 2015-10-03 MED ORDER — IBUPROFEN 400 MG PO TABS
400.0000 mg | ORAL_TABLET | Freq: Once | ORAL | Status: AC
  Administered 2015-10-03: 400 mg via ORAL
  Filled 2015-10-03: qty 1

## 2015-10-03 MED ORDER — IBUPROFEN 400 MG PO TABS: 400.0000 mg | ORAL_TABLET | Freq: Two times a day (BID) | ORAL | Status: DC

## 2015-10-03 NOTE — ED Provider Notes (Signed)
I received this pt in signout from Dr. Mariane Masters. Patient presented with right shoulder pain and plain films were negative for acute injury. We were awaiting lab work results to evaluate for acute infection. WBC normal and ESR only slightly elevated at 51. On reexamination, the patient is sitting comfortably in a chair. I discussed results with the patient's son and emphasized the importance of close monitoring for the development of any new symptoms such as warmth, redness, fever, or worsening swelling. Son voiced understanding of return precautions and patient discharged in satisfactory condition.  Sharlett Iles, MD 10/03/15 [phone]

## 2015-10-03 NOTE — ED Notes (Signed)
Patient sling applied patient sitting in chair beside son. Patient trying to take sling off. Patient explained why to leave sling on and son trying to get patient to stop taking it off.

## 2015-10-03 NOTE — ED Notes (Addendum)
Pt c/o right shoulder pain since last Friday-painful to touch per son-?fall per pt son-pt with hx of dementia and unable to answer ?s appropriately-NAD-steady but slow gait with own cane-pt became agitated when attempted to have pt sit in w/c-pt walked with own cane, son and EMT escort to tx area

## 2015-10-03 NOTE — ED Notes (Addendum)
EMT Kayla applied Lrg ice bag to Pts shoulder

## 2015-10-03 NOTE — Discharge Instructions (Signed)
We saw you in the ER for the shoulder pain. Xrays and labs are normal.  We are not sure what is causing your symptoms. Use the sling, see the Orthopedic doctor in 1-2 weeks. The workup in the ER is not complete, and is limited to screening for life threatening and emergent conditions only, so please see a primary care doctor for further evaluation.   Heat Therapy Heat therapy can help ease sore, stiff, injured, and tight muscles and joints. Heat relaxes your muscles, which may help ease your pain. Heat therapy should only be used on old, pre-existing, or long-lasting (chronic) injuries. Do not use heat therapy unless told by your doctor. HOW TO USE HEAT THERAPY There are several different kinds of heat therapy, including:  Moist heat pack.  Warm water bath.  Hot water bottle.  Electric heating pad.  Heated gel pack.  Heated wrap.  Electric heating pad. GENERAL HEAT THERAPY RECOMMENDATIONS   Do not sleep while using heat therapy. Only use heat therapy while you are awake.  Your skin may turn pink while using heat therapy. Do not use heat therapy if your skin turns red.  Do not use heat therapy if you have new pain.  High heat or long exposure to heat can cause burns. Be careful when using heat therapy to avoid burning your skin.  Do not use heat therapy on areas of your skin that are already irritated, such as with a rash or sunburn. GET HELP IF:   You have blisters, redness, swelling (puffiness), or numbness.  You have new pain.  Your pain is worse. MAKE SURE YOU:  Understand these instructions.  Will watch your condition.  Will get help right away if you are not doing well or get worse.   This information is not intended to replace advice given to you by your health care provider. Make sure you discuss any questions you have with your health care provider.   Document Released: 10/07/2011 Document Revised: 08/05/2014 Document Reviewed: 09/07/2013 Elsevier  Interactive Patient Education 2016 Elsevier Inc.  Shoulder Pain The shoulder is the joint that connects your arms to your body. The bones that form the shoulder joint include the upper arm bone (humerus), the shoulder blade (scapula), and the collarbone (clavicle). The top of the humerus is shaped like a ball and fits into a rather flat socket on the scapula (glenoid cavity). A combination of muscles and strong, fibrous tissues that connect muscles to bones (tendons) support your shoulder joint and hold the ball in the socket. Small, fluid-filled sacs (bursae) are located in different areas of the joint. They act as cushions between the bones and the overlying soft tissues and help reduce friction between the gliding tendons and the bone as you move your arm. Your shoulder joint allows a wide range of motion in your arm. This range of motion allows you to do things like scratch your back or throw a ball. However, this range of motion also makes your shoulder more prone to pain from overuse and injury. Causes of shoulder pain can originate from both injury and overuse and usually can be grouped in the following four categories:  Redness, swelling, and pain (inflammation) of the tendon (tendinitis) or the bursae (bursitis).  Instability, such as a dislocation of the joint.  Inflammation of the joint (arthritis).  Broken bone (fracture). HOME CARE INSTRUCTIONS   Apply ice to the sore area.  Put ice in a plastic bag.  Place a towel between your skin  and the bag.  Leave the ice on for 15-20 minutes, 3-4 times per day for the first 2 days, or as directed by your health care provider.  Stop using cold packs if they do not help with the pain.  If you have a shoulder sling or immobilizer, wear it as long as your caregiver instructs. Only remove it to shower or bathe. Move your arm as little as possible, but keep your hand moving to prevent swelling.  Squeeze a soft ball or foam pad as much as  possible to help prevent swelling.  Only take over-the-counter or prescription medicines for pain, discomfort, or fever as directed by your caregiver. SEEK MEDICAL CARE IF:   Your shoulder pain increases, or new pain develops in your arm, hand, or fingers.  Your hand or fingers become cold and numb.  Your pain is not relieved with medicines. SEEK IMMEDIATE MEDICAL CARE IF:   Your arm, hand, or fingers are numb or tingling.  Your arm, hand, or fingers are significantly swollen or turn white or blue. MAKE SURE YOU:   Understand these instructions.  Will watch your condition.  Will get help right away if you are not doing well or get worse.   This information is not intended to replace advice given to you by your health care provider. Make sure you discuss any questions you have with your health care provider.   Document Released: 04/24/2005 Document Revised: 08/05/2014 Document Reviewed: 11/07/2014 Elsevier Interactive Patient Education [employer]

## 2015-10-03 NOTE — ED Notes (Signed)
Pt was not discharged at 1700 from computer.

## 2015-10-03 NOTE — ED Provider Notes (Signed)
CSN: [id number]     Arrival date & time 10/03/15  1239 History   First MD Initiated Contact with Patient 10/03/15 1252     Chief Complaint  Patient presents with  . Fall     (Consider location/radiation/quality/duration/timing/severity/associated sxs/prior Treatment) HPI Comments: 82 y.o. female with a history of hypertension, hyperlipidemia, previous stroke and dementia who was brought to the emergency room with cc of shoulder pain. LEVEL 5 CAVEAT FOR DEMENTIA. Pt unable to provide any meaningful hx. Son reports that pt has been c/o shoulder pain x 3 days. She thinks he might have falled or bumped into something, but he is not sure. Pt has had no n/v/f/c. She still tries ADLs with the same hand, but struggles with it.  No hx of DM, immunosupression. No recent surgeries.  Patient is a 82 y.o. female presenting with fall. The history is provided by the patient.  Fall    Past Medical History  Diagnosis Date  . Atherosclerotic cardiovascular disease   . Hypertension   . Dementia   . FH: dementia   . Acid reflux   . Hypercholesteremia   . Blindness     legal blindness,left eye,histor  . H/O: stroke     ocular stroke,   . Postmenopausal   . Trochanteric bursitis    History reviewed. No pertinent past surgical history. No family history on file. Social History  Substance Use Topics  . Smoking status: Never Smoker   . Smokeless tobacco: None  . Alcohol Use: No   OB History    No data available     Review of Systems  Unable to perform ROS: Dementia      Allergies  Review of patient's allergies indicates no known allergies.  Home Medications   Prior to Admission medications   Medication Sig Start Date End Date Taking? Authorizing Provider  aspirin 81 MG EC tablet Take 4 tablets (325 mg total) by mouth at bedtime. Swallow whole. Patient taking differently: Take 81 mg by mouth at bedtime. Swallow whole. 04/05/15   Dorothea Ogle, MD  atorvastatin (LIPITOR) 10 MG tablet  Take 1 tablet (10 mg total) by mouth daily at 6 PM. 04/05/15   Dorothea Ogle, MD  benazepril (LOTENSIN) 20 MG tablet Take 20 mg by mouth daily.     Historical Provider, MD  haloperidol (HALDOL) 0.5 MG tablet Take 0.5 mg by mouth daily.    Historical Provider, MD  ibuprofen (ADVIL,MOTRIN) 400 MG tablet Take 1 tablet (400 mg total) by mouth 2 (two) times daily. 10/03/15   Derwood Kaplan, MD  metoprolol tartrate (LOPRESSOR) 25 MG tablet Take 25 mg by mouth daily.     Historical Provider, MD  nitroGLYCERIN (NITROSTAT) 0.4 MG SL tablet Place 0.4 mg under the tongue every 5 (five) minutes as needed for chest pain.    Historical Provider, MD   BP 200/79 mmHg  Pulse 58  Temp(Src) 98.1 F (36.7 C) (Oral)  Resp 18  SpO2 98% Physical Exam  Constitutional: She is oriented to person, place, and time. She appears well-developed.  HENT:  Head: Normocephalic and atraumatic.  Eyes: EOM are normal.  Neck: Normal range of motion. Neck supple.  Cardiovascular: Normal rate and intact distal pulses.   Pulmonary/Chest: Effort normal.  Abdominal: Bowel sounds are normal.  Musculoskeletal:  R shoulder has mild swelling, no warmth to touch or redness. Pt has tenderness to palpation and with ROM. Distal pulse is normal. No deformity.  Neurological: She is alert and oriented  to person, place, and time.  Skin: Skin is warm and dry.  Nursing note and vitals reviewed.   ED Course  Procedures (including critical care time) Labs Review Labs Reviewed  CBC WITH DIFFERENTIAL/PLATELET - Abnormal; Notable for the following:    Hemoglobin 11.6 (*)    HCT 35.6 (*)    All other components within normal limits  BASIC METABOLIC PANEL - Abnormal; Notable for the following:    GFR calc non Af Amer 54 (*)    All other components within normal limits  SEDIMENTATION RATE - Abnormal; Notable for the following:    Sed Rate 51 (*)    All other components within normal limits  C-REACTIVE PROTEIN    Imaging Review No results  found. I have personally reviewed and evaluated these images and lab results as part of my medical decision-making.   EKG Interpretation None      MDM   Final diagnoses:  Acute shoulder pain, right    Pt comes in with cc of shoulder pain. Pt has acute pain x 3 days. She has no signs of septic joint on exam. She has no risk factors for septic joint either. Possibly Oa vs. Crystalloid pathology.  I will still get screening sed rate and basic labs - we have a low pretest probability for septic joint, so we will react to elevated WC or sedrate only if profoundly elevated. I am ordering the test due to the obvious limitation in acquiring meaningful history from the patient.  If sed rate < 60 and pt's labs and vitals stay stable - she will see ortho for f.u and we will provide sling.   Derwood KaplanAnkit Jaydence Vanyo, MD 10/06/15 1527

## 2016-02-12 ENCOUNTER — Emergency Department (HOSPITAL_COMMUNITY): Payer: Medicare Other

## 2016-02-12 ENCOUNTER — Encounter (HOSPITAL_COMMUNITY): Payer: Self-pay | Admitting: Emergency Medicine

## 2016-02-12 ENCOUNTER — Inpatient Hospital Stay (HOSPITAL_COMMUNITY)
Admission: EM | Admit: 2016-02-12 | Discharge: 2016-02-15 | DRG: 689 | Disposition: A | Discharge status: Skilled Nursing Facility | Payer: Medicare Other | Attending: Internal Medicine | Admitting: Internal Medicine

## 2016-02-12 DIAGNOSIS — D649 Anemia, unspecified: Secondary | ICD-10-CM | POA: Diagnosis present

## 2016-02-12 DIAGNOSIS — R4182 Altered mental status, unspecified: Secondary | ICD-10-CM | POA: Diagnosis present

## 2016-02-12 DIAGNOSIS — I482 Chronic atrial fibrillation, unspecified: Secondary | ICD-10-CM | POA: Diagnosis present

## 2016-02-12 DIAGNOSIS — R935 Abnormal findings on diagnostic imaging of other abdominal regions, including retroperitoneum: Secondary | ICD-10-CM | POA: Diagnosis not present

## 2016-02-12 DIAGNOSIS — E46 Unspecified protein-calorie malnutrition: Secondary | ICD-10-CM | POA: Diagnosis present

## 2016-02-12 DIAGNOSIS — F0390 Unspecified dementia without behavioral disturbance: Secondary | ICD-10-CM | POA: Diagnosis present

## 2016-02-12 DIAGNOSIS — H5442 Blindness, left eye, normal vision right eye: Secondary | ICD-10-CM | POA: Diagnosis present

## 2016-02-12 DIAGNOSIS — I10 Essential (primary) hypertension: Secondary | ICD-10-CM | POA: Diagnosis present

## 2016-02-12 DIAGNOSIS — Z66 Do not resuscitate: Secondary | ICD-10-CM | POA: Diagnosis present

## 2016-02-12 DIAGNOSIS — N2889 Other specified disorders of kidney and ureter: Secondary | ICD-10-CM | POA: Diagnosis present

## 2016-02-12 DIAGNOSIS — E7800 Pure hypercholesterolemia, unspecified: Secondary | ICD-10-CM | POA: Diagnosis present

## 2016-02-12 DIAGNOSIS — D696 Thrombocytopenia, unspecified: Secondary | ICD-10-CM | POA: Diagnosis present

## 2016-02-12 DIAGNOSIS — I959 Hypotension, unspecified: Secondary | ICD-10-CM | POA: Diagnosis present

## 2016-02-12 DIAGNOSIS — G9340 Encephalopathy, unspecified: Secondary | ICD-10-CM | POA: Diagnosis present

## 2016-02-12 DIAGNOSIS — E785 Hyperlipidemia, unspecified: Secondary | ICD-10-CM | POA: Diagnosis present

## 2016-02-12 DIAGNOSIS — Z6822 Body mass index (BMI) 22.0-22.9, adult: Secondary | ICD-10-CM | POA: Diagnosis not present

## 2016-02-12 DIAGNOSIS — I9589 Other hypotension: Secondary | ICD-10-CM

## 2016-02-12 DIAGNOSIS — E860 Dehydration: Secondary | ICD-10-CM | POA: Diagnosis present

## 2016-02-12 DIAGNOSIS — G40909 Epilepsy, unspecified, not intractable, without status epilepticus: Secondary | ICD-10-CM | POA: Diagnosis present

## 2016-02-12 DIAGNOSIS — I2510 Atherosclerotic heart disease of native coronary artery without angina pectoris: Secondary | ICD-10-CM | POA: Diagnosis present

## 2016-02-12 DIAGNOSIS — N390 Urinary tract infection, site not specified: Secondary | ICD-10-CM | POA: Diagnosis not present

## 2016-02-12 DIAGNOSIS — E876 Hypokalemia: Secondary | ICD-10-CM | POA: Diagnosis present

## 2016-02-12 DIAGNOSIS — Z8673 Personal history of transient ischemic attack (TIA), and cerebral infarction without residual deficits: Secondary | ICD-10-CM

## 2016-02-12 DIAGNOSIS — D6489 Other specified anemias: Secondary | ICD-10-CM | POA: Diagnosis not present

## 2016-02-12 DIAGNOSIS — K5641 Fecal impaction: Secondary | ICD-10-CM | POA: Diagnosis present

## 2016-02-12 DIAGNOSIS — E872 Acidosis: Secondary | ICD-10-CM | POA: Diagnosis present

## 2016-02-12 DIAGNOSIS — K562 Volvulus: Secondary | ICD-10-CM | POA: Diagnosis not present

## 2016-02-12 DIAGNOSIS — N281 Cyst of kidney, acquired: Secondary | ICD-10-CM | POA: Diagnosis present

## 2016-02-12 DIAGNOSIS — R1084 Generalized abdominal pain: Secondary | ICD-10-CM | POA: Diagnosis not present

## 2016-02-12 DIAGNOSIS — Z7982 Long term (current) use of aspirin: Secondary | ICD-10-CM | POA: Diagnosis not present

## 2016-02-12 DIAGNOSIS — R109 Unspecified abdominal pain: Secondary | ICD-10-CM | POA: Diagnosis present

## 2016-02-12 DIAGNOSIS — R7989 Other specified abnormal findings of blood chemistry: Secondary | ICD-10-CM | POA: Diagnosis not present

## 2016-02-12 HISTORY — DX: Unspecified osteoarthritis, unspecified site: M19.90

## 2016-02-12 HISTORY — DX: Unspecified convulsions: R56.9

## 2016-02-12 HISTORY — DX: Cerebral infarction, unspecified: I63.9

## 2016-02-12 HISTORY — DX: Dysphagia, unspecified: R13.10

## 2016-02-12 HISTORY — DX: Other specified disorders of kidney and ureter: N28.89

## 2016-02-12 HISTORY — DX: Hyperlipidemia, unspecified: E78.5

## 2016-02-12 HISTORY — DX: Unspecified atrial fibrillation: I48.91

## 2016-02-12 LAB — PROTIME-INR
INR: 1.25 (ref 0.00–1.49)
PROTHROMBIN TIME: 15.9 s — AB (ref 11.6–15.2)

## 2016-02-12 LAB — MRSA PCR SCREENING: MRSA by PCR: NEGATIVE

## 2016-02-12 LAB — URINE MICROSCOPIC-ADD ON

## 2016-02-12 LAB — URINALYSIS, ROUTINE W REFLEX MICROSCOPIC
BILIRUBIN URINE: NEGATIVE
GLUCOSE, UA: NEGATIVE mg/dL
KETONES UR: NEGATIVE mg/dL
Nitrite: POSITIVE — AB
PROTEIN: 30 mg/dL — AB
Specific Gravity, Urine: 1.025 (ref 1.005–1.030)
pH: 5.5 (ref 5.0–8.0)

## 2016-02-12 LAB — I-STAT CHEM 8, ED
BUN: 11 mg/dL (ref 6–20)
CREATININE: 1 mg/dL (ref 0.44–1.00)
Calcium, Ion: 1.13 mmol/L (ref 1.12–1.23)
Chloride: 101 mmol/L (ref 101–111)
Glucose, Bld: 138 mg/dL — ABNORMAL HIGH (ref 65–99)
HEMATOCRIT: 38 % (ref 36.0–46.0)
HEMOGLOBIN: 12.9 g/dL (ref 12.0–15.0)
POTASSIUM: 3.3 mmol/L — AB (ref 3.5–5.1)
SODIUM: 141 mmol/L (ref 135–145)
TCO2: 25 mmol/L (ref 0–100)

## 2016-02-12 LAB — COMPREHENSIVE METABOLIC PANEL
ALK PHOS: 95 U/L (ref 38–126)
ALT: 15 U/L (ref 14–54)
AST: 31 U/L (ref 15–41)
Albumin: 3.1 g/dL — ABNORMAL LOW (ref 3.5–5.0)
Anion gap: 9 (ref 5–15)
BUN: 12 mg/dL (ref 6–20)
CALCIUM: 8.5 mg/dL — AB (ref 8.9–10.3)
CO2: 25 mmol/L (ref 22–32)
CREATININE: 1.04 mg/dL — AB (ref 0.44–1.00)
Chloride: 104 mmol/L (ref 101–111)
GFR, EST AFRICAN AMERICAN: 54 mL/min — AB (ref >60)
GFR, EST NON AFRICAN AMERICAN: 47 mL/min — AB (ref >60)
Glucose, Bld: 141 mg/dL — ABNORMAL HIGH (ref 65–99)
Potassium: 3.2 mmol/L — ABNORMAL LOW (ref 3.5–5.1)
Sodium: 138 mmol/L (ref 135–145)
Total Bilirubin: 0.9 mg/dL (ref 0.3–1.2)
Total Protein: 6.4 g/dL — ABNORMAL LOW (ref 6.5–8.1)

## 2016-02-12 LAB — I-STAT CG4 LACTIC ACID, ED
LACTIC ACID, VENOUS: 4.24 mmol/L — AB (ref 0.5–1.9)
LACTIC ACID, VENOUS: 1.74 mmol/L (ref 0.5–1.9)

## 2016-02-12 LAB — I-STAT TROPONIN, ED: Troponin i, poc: 0.03 ng/mL (ref 0.00–0.08)

## 2016-02-12 LAB — CBC WITH DIFFERENTIAL/PLATELET
BASOS PCT: 0 %
Basophils Absolute: 0 10*3/uL (ref 0.0–0.1)
EOS PCT: 0 %
Eosinophils Absolute: 0 10*3/uL (ref 0.0–0.7)
HEMATOCRIT: 36.8 % (ref 36.0–46.0)
HEMOGLOBIN: 12.2 g/dL (ref 12.0–15.0)
LYMPHS PCT: 18 %
Lymphs Abs: 1.9 10*3/uL (ref 0.7–4.0)
MCH: 30 pg (ref 26.0–34.0)
MCHC: 33.2 g/dL (ref 30.0–36.0)
MCV: 90.6 fL (ref 78.0–100.0)
MONO ABS: 0.5 10*3/uL (ref 0.1–1.0)
Monocytes Relative: 5 %
NEUTROS ABS: 8.1 10*3/uL — AB (ref 1.7–7.7)
NEUTROS PCT: 77 %
RBC: 4.06 MIL/uL (ref 3.87–5.11)
RDW: 14.8 % (ref 11.5–15.5)
WBC: 10.5 10*3/uL (ref 4.0–10.5)

## 2016-02-12 LAB — TROPONIN I: Troponin I: 0.04 ng/mL (ref <0.03)

## 2016-02-12 MED ORDER — SODIUM CHLORIDE 0.9 % IV BOLUS (SEPSIS)
1000.0000 mL | Freq: Once | INTRAVENOUS | Status: AC
  Administered 2016-02-12: 1000 mL via INTRAVENOUS

## 2016-02-12 MED ORDER — VANCOMYCIN HCL IN DEXTROSE 1-5 GM/200ML-% IV SOLN
1000.0000 mg | Freq: Once | INTRAVENOUS | Status: AC
  Administered 2016-02-12: 1000 mg via INTRAVENOUS
  Filled 2016-02-12: qty 200

## 2016-02-12 MED ORDER — SODIUM CHLORIDE 0.9 % IV SOLN
INTRAVENOUS | Status: AC
  Administered 2016-02-12: 19:00:00 via INTRAVENOUS

## 2016-02-12 MED ORDER — PIPERACILLIN-TAZOBACTAM 3.375 G IVPB
3.3750 g | Freq: Once | INTRAVENOUS | Status: AC
  Administered 2016-02-12: 3.375 g via INTRAVENOUS
  Filled 2016-02-12: qty 50

## 2016-02-12 MED ORDER — DEXTROSE 5 % IV SOLN
1.0000 g | INTRAVENOUS | Status: DC
  Administered 2016-02-13 – 2016-02-15 (×3): 1 g via INTRAVENOUS
  Filled 2016-02-12 (×4): qty 10

## 2016-02-12 NOTE — ED Notes (Signed)
Patient with one loose stool. Yellow with noted mucus.

## 2016-02-12 NOTE — Progress Notes (Signed)
CT reviewed; discussed with Dr. Manus Gunningancour.  No volvulus. Dilated colon with increase stool in rectal vault.  ? Ogilvies's syndrome vs element of fecal impaction vs. Overlap.  No need for sigmoidoscopy at this time.  Will give 2 tap water enemas this evening followed by indwelling rectal tube - moving patient from sided to side q 2hours.  Allow sips of clear liquids this evening.  Re-assess in am.  Would likely benefit from a Miralax regimen chronically. Discussed with nursing staff.

## 2016-02-12 NOTE — ED Notes (Signed)
Patient from [facility]. AMS today. Facility states K+ was 3.0. H/o seizures. Patient arouses to physical stimulus.

## 2016-02-12 NOTE — ED Provider Notes (Signed)
CSN: [id number]     Arrival date & time 02/12/16  1320 History  By signing my name below, I, Rosario Adie, attest that this documentation has been prepared under the direction and in the presence of Glynn Octave, MD. Electronically Signed: Rosario Adie, ED Scribe. 02/12/2016. 1:50 PM.   Chief Complaint  Patient presents with  . Fatigue   LEVEL 5 CAVEAT: HPI and ROS limited due to nonverbal and AMS  The history is provided by the nursing home. The history is limited by the condition of the patient and the absence of a caregiver. No language interpreter was used.   HPI Comments: Bennetta Rudden is a 82 y.o. female with a PMHx of HTN, dementia, hypercholesteremia, stroke, HLD, arthritis, and seizures who presents to the Emergency Department sent in from [facility] because decreased level of consciousness. No known last known normal. Facility states that her last potasium level was 3.0. Pt is not on anticoagulants.   Discussed with pt's son, Kimika Streater, about care, and he agrees with the DNR and care plan that is in place per the pts wishes prior to this episode. Her first emergency contact, Deliyah Muckle, was not available secondary [place] the beach.    Past Medical History  Diagnosis Date  . Atherosclerotic cardiovascular disease   . Hypertension   . Dementia   . FH: dementia   . Acid reflux   . Hypercholesteremia   . Blindness     legal blindness,left eye,histor  . H/O: stroke     ocular stroke,   . Postmenopausal   . Trochanteric bursitis   . Dysphagia   . Hyperlipemia   . Atrial fibrillation (HCC)   . Arthritis   . Seizures (HCC)   . Stroke [facility])     tia per nursing facilities   History reviewed. No pertinent past surgical history. No family history on file. Social History  Substance Use Topics  . Smoking status: Never Smoker   . Smokeless tobacco: None  . Alcohol Use: No   OB History    No data available     Review of Systems   LEVEL 5 CAVEAT:  HPI and ROS limited due to nonverbal and AMS  Allergies  Review of patient's allergies indicates no known allergies.  Home Medications   Prior to Admission medications   Medication Sig Start Date End Date Taking? Authorizing Provider  aspirin 81 MG EC tablet Take 4 tablets (325 mg total) by mouth at bedtime. Swallow whole. Patient taking differently: Take 81 mg by mouth at bedtime. Swallow whole. 04/05/15   Dorothea Ogle, MD  atorvastatin (LIPITOR) 10 MG tablet Take 1 tablet (10 mg total) by mouth daily at 6 PM. 04/05/15   Dorothea Ogle, MD  benazepril (LOTENSIN) 20 MG tablet Take 20 mg by mouth daily.     Historical Provider, MD  haloperidol (HALDOL) 0.5 MG tablet Take 0.5 mg by mouth daily.    Historical Provider, MD  ibuprofen (ADVIL,MOTRIN) 400 MG tablet Take 1 tablet (400 mg total) by mouth 2 (two) times daily. 10/03/15   Derwood Kaplan, MD  metoprolol tartrate (LOPRESSOR) 25 MG tablet Take 25 mg by mouth daily.     Historical Provider, MD  nitroGLYCERIN (NITROSTAT) 0.4 MG SL tablet Place 0.4 mg under the tongue every 5 (five) minutes as needed for chest pain.    Historical Provider, MD   BP 58/42 mmHg  Pulse 93  Temp(Src) 96.4 F (35.8 C) (Rectal)  Resp  23  Wt 135 lb (61.236 kg)  SpO2 100%   Physical Exam  Constitutional: She appears well-developed and well-nourished. No distress.  Pt is mumbling a few words on exam.   HENT:  Head: Normocephalic and atraumatic.  Dry mucous membranes  Eyes:  Right pupil is 3mm and non-reactive, left pupil is 5mm and non-reactive.   Neck: Normal range of motion.  Cardiovascular: Normal rate, regular rhythm and normal heart sounds.   Pulmonary/Chest: Effort normal and breath sounds normal. No respiratory distress. She has no wheezes. She has no rales.  Abdominal: Soft. She exhibits no distension. There is no tenderness. There is no rebound and no guarding.  Does not appear to be tender on exam.  Musculoskeletal: Normal range of motion.   Neurological:  Does not follow commands, and pt is moving all extremities spontaneously and without difficulty.   Skin: Skin is warm and dry.  Psychiatric: She has a normal mood and affect. Judgment normal.  Nursing note and vitals reviewed.  ED Course  Procedures (including critical care time)  DIAGNOSTIC STUDIES: Oxygen Saturation is 100% on RA, normal by my interpretation.   Labs Review Labs Reviewed  CBC WITH DIFFERENTIAL/PLATELET - Abnormal; Notable for the following:    Neutro Abs 8.1 (*)    All other components within normal limits  COMPREHENSIVE METABOLIC PANEL - Abnormal; Notable for the following:    Potassium 3.2 (*)    Glucose, Bld 141 (*)    Creatinine, Ser 1.04 (*)    Calcium 8.5 (*)    Total Protein 6.4 (*)    Albumin 3.1 (*)    GFR calc non Af Amer 47 (*)    GFR calc Af Amer 54 (*)    All other components within normal limits  PROTIME-INR - Abnormal; Notable for the following:    Prothrombin Time 15.9 (*)    All other components within normal limits  TROPONIN I - Abnormal; Notable for the following:    Troponin I 0.04 (*)    All other components within normal limits  URINALYSIS, ROUTINE W REFLEX MICROSCOPIC (NOT AT [facility]) - Abnormal; Notable for the following:    Hgb urine dipstick SMALL (*)    Protein, ur 30 (*)    Nitrite POSITIVE (*)    Leukocytes, UA SMALL (*)    All other components within normal limits  URINE MICROSCOPIC-ADD ON - Abnormal; Notable for the following:    Squamous Epithelial / LPF 0-5 (*)    Bacteria, UA MANY (*)    All other components within normal limits  I-STAT CG4 LACTIC ACID, ED - Abnormal; Notable for the following:    Lactic Acid, Venous 4.24 (*)    All other components within normal limits  I-STAT CHEM 8, ED - Abnormal; Notable for the following:    Potassium 3.3 (*)    Glucose, Bld 138 (*)    All other components within normal limits  MRSA PCR SCREENING  CULTURE, BLOOD (ROUTINE X 2)  CULTURE, BLOOD (ROUTINE X 2)   URINE CULTURE  I-STAT TROPOININ, ED  I-STAT CG4 LACTIC ACID, ED    Imaging Review Ct Abdomen Pelvis Wo Contrast  02/12/2016  CLINICAL DATA:  Altered mental status, dilated colon on x-ray. Dementia. EXAM: CT ABDOMEN AND PELVIS WITHOUT CONTRAST TECHNIQUE: Multidetector CT imaging of the abdomen and pelvis was performed following the standard protocol without IV contrast. COMPARISON:  Abdomen plain film from earlier same day. FINDINGS: Lower chest: Small consolidations at each lung base, most likely  atelectasis. Hepatobiliary: 1.1 and 1 cm hypodense lesions within the upper right liver lobe, too small to definitively characterize but most likely benign cysts. Liver otherwise unremarkable. Gallbladder appears normal. No bile duct dilatation seen. Pancreas: No mass or inflammatory process identified on this un-enhanced exam. Spleen: Within normal limits in size. Adrenals/Urinary Tract: Adrenal glands appear normal. Hyperdense mass exophytic to the posterior cortex of the right kidney measures 1.5 cm, compatible with hemorrhagic cyst or solid mass. No right-sided renal stone or hydronephrosis. Left kidney appears normal without mass, stone or hydronephrosis Stomach/Bowel: Prominently distended gas-filled sigmoid colon. More proximal colon is also mildly distended with air and stool. Moderate amount of stool within the rectal vault. No evidence of acute obstruction at the level of the sigmoid colon. No sigmoid volvulus. Small bowel is normal in caliber throughout. No evidence of bowel wall thickening or bowel wall inflammation. Appendix is not seen but there are no inflammatory changes about the cecum to suggest acute appendicitis. Vascular/Lymphatic: Atherosclerotic changes are seen throughout the abdominal aorta. No enlarged lymph nodes seen within the abdomen or pelvis. Reproductive: No mass or other significant abnormality. Other: No free fluid or abscess collection seen. Mild fluid stranding within the  presacral space is of uncertain significance, perhaps reactive given the adjacent sigmoid colon distension. No free intraperitoneal air. Musculoskeletal: Degenerative changes are seen throughout the scoliotic thoracolumbar spine, moderate in degree. No acute or suspicious osseous finding. Superficial soft tissues are unremarkable. IMPRESSION: 1. No sigmoid volvulus. No evidence of acute large bowel or small bowel obstruction. 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 There is a moderate amount of stool and fluid within the rectal vault (partial impaction? ). 2. Hyperdense mass exophytic to the posterior cortex of the right kidney, measuring 1.5 cm, difficult to characterize without intravascular contrast. Solid mass cannot be excluded but favor benign hemorrhagic cyst. 3. Probable liver cysts, as described above. 4. Aortic atherosclerosis. Electronically Signed   By: Bary RichardStan  Maynard M.D.   On: 02/12/2016 18:19   Ct Head Wo Contrast  02/12/2016  CLINICAL DATA:  Altered mental status. History of seizures, stroke, dementia. EXAM: CT HEAD WITHOUT CONTRAST TECHNIQUE: Contiguous axial images were obtained from the base of the skull through the vertex without intravenous contrast. COMPARISON:  Head CT dated 06/26/2015 and brain MRI dated 06/27/2015. FINDINGS: Brain: Again noted is generalized age related parenchymal atrophy with commensurate dilatation of the ventricles and sulci. Also again noted are chronic small vessel ischemic changes within the bilateral periventricular and subcortical white matter regions. There is no mass, hemorrhage, edema or other evidence of acute parenchymal abnormality. No extra-axial hemorrhage. Vascular: No hyperdense vessel or unexpected calcification. There are chronic calcified atherosclerotic changes of the large vessels at the skull base. Skull:  Negative for fracture or focal lesion. Sinuses/Orbits: No acute findings. Other: None. IMPRESSION: 1. No acute intracranial findings. No intracranial mass, hemorrhage or edema. 2. Atrophy and chronic ischemic changes in the white matter. Electronically Signed   By: Bary RichardStan  Maynard M.D.   On: 02/12/2016 18:02   Dg Chest Portable 1 View  02/12/2016  CLINICAL DATA:  82 year old female with history of altered mental status today. EXAM: PORTABLE CHEST 1 VIEW COMPARISON:  Chest x-ray 06/18/2015. FINDINGS: Skin fold projecting over the mid to lower left hemithorax. Lung volumes are low. No consolidative airspace disease. No pleural effusions. No evidence of pulmonary edema. Heart  size is normal. The patient is rotated to the right on today's exam, resulting in distortion of the mediastinal contours and reduced diagnostic sensitivity and specificity for mediastinal pathology. Atherosclerosis in the thoracic aorta. IMPRESSION: 1. Low lung volumes without radiographic evidence of acute cardiopulmonary disease. 2. Aortic atherosclerosis. Electronically Signed   By: Trudie Reed M.D.   On: 02/12/2016 14:19   Dg Abd Portable 1v  02/12/2016  CLINICAL DATA:  Altered mental status EXAM: PORTABLE ABDOMEN - 1 VIEW COMPARISON:  None. FINDINGS: Dilated loop of sigmoid colon. While nonspecific, this appearance does raise concern for sigmoid volvulus. IMPRESSION: Dilated loop of sigmoid colon, nonspecific but raising concern for sigmoid volvulus. Consider CT abdomen pelvis for further evaluation as clinically warranted. Electronically Signed   By: Charline Bills M.D.   On: 02/12/2016 14:22    I have personally reviewed and evaluated these images and lab results as part of my medical decision-making.   EKG Interpretation   Date/Time:  Monday February 12 2016 13:40:08 EDT Ventricular Rate:  92 PR Interval:    QRS Duration: 108 QT Interval:  413 QTC Calculation: 511 R Axis:   -34 Text Interpretation:  Sinus rhythm  Abnormal R-wave progression, early  transition Left ventricular hypertrophy Prolonged QT interval No  significant change was found Confirmed by Manus Gunning  MD, Miquan Tandon [phone]) on  02/12/2016 2:15:12 PM      MDM   Final diagnoses:  Sigmoid volvulus (HCC)  Dehydration  Other specified hypotension   Patient from nursing home minimally responsive. Found to be hypotensive. No reported fever, vomiting or diarrhea. She is mostly nonverbal on arrival and will not follow commands.  Hypotensive in the 50s. Appears dry on exam. IV fluids are begun she is afebrile.  Attempted to contact her power of attorney Shawn without answer. Spoke with patient's son Jesusita Oka who confirms DO NOT RESUSCITATE status and is not aware of any recent illnesses.  Plain film x-ray concerning for sigmoid volvulus. Blood pressure has improved to 105 systolic after 1.5 L of fluid. Concern for sigmoid volvulus discussed with Dr. Jena Gauss who will evaluate patient. Labs will be obtained. Patient's son has arrived and is at bedside.  Cr normal. K 3.  Lactate 4. Troponin negative. Afebrile. No leukocytosis. IVF given. Cultures sent. Broad spectrum antibiotics begun.  Dr. Jena Gauss has seen patient and recommends CT scan and medical admission.  Admission d/w Dr. Selena Batten.  Blood pressure has stabilized in the ED and patient appears more alert. CT obtained and does not show volvulus. Dr. Jena Gauss updated who gave additional orders to ICU nurses. UA consistent with infection, antibiotics given.  EMERGENCY DEPARTMENT [place] ABD/AORTA EXAM Study: Limited Ultrasound of the Abdominal Aorta.  INDICATIONS:Hypotension and Age>55 Indication: Multiple views of the abdominal aorta are obtained from the diaphragmatic hiatus to the aortic bifurcation in transverse and sagittal planes with a multi- Frequency probe.  PERFORMED BY: Myself  IMAGES ARCHIVED?: Yes  FINDINGS: Free fluid absent  LIMITATIONS:  Body habitus and Abdominal pain  INTERPRETATION:   Unable to visualize aorta, no free fluid COMMENT:  Bowel gas impairs visualization  CRITICAL CARE Performed by: Glynn Octave Total critical care time: 40 minutes Critical care time was exclusive of separately billable procedures and treating other patients. Critical care was necessary to treat or prevent imminent or life-threatening deterioration. Critical care was time spent personally by me on the following activities: development of treatment plan with patient and/or surrogate as well as nursing, discussions with consultants, evaluation of patient's response to  treatment, examination of patient, obtaining history from patient or surrogate, ordering and performing treatments and interventions, ordering and review of laboratory studies, ordering and review of radiographic studies, pulse oximetry and re-evaluation of patient's condition.   I personally performed the services described in this documentation, which was scribed in my presence. The recorded information has been reviewed and is accurate.    Glynn Octave, MD 02/12/16 [phone]

## 2016-02-12 NOTE — ED Notes (Signed)
Warm blankets x 2 applied to warm patient.

## 2016-02-12 NOTE — Consult Note (Signed)
Referring Provider: No ref. provider found Primary Care Physician:  Bernerd LimboARIZA,FERNANDO ENRIQUE, MD Primary Gastroenterologist:  Dr. Jena Gaussourk  Date of Admission: 02/12/16 Date of Consultation: 02/12/16  Reason for Consultation:  Possible sigmoid volvulus  HPI:  Joan Mathis is a 82 y.o. female who lives at [facility] nursing home with history provided to ER physician from the facility due to patient's condition limiting subjective information as well as no caregiver at bedside. She does have a PMH of dmentia, hypercholesteremia, strove, seizures who presented tot eh ED fur to decreased level of consciouness with no known last normal. Family contact is Joan Mathis. Patient noted to be mumbling a few words, abdomen did not appear to be tender on exam, not following commands but freely moves all extremities. No reported fever, found to be hypotensive with SBP in the 50s in the ER, dehydrated, afebrile. Patient is a DNR. Abdominal XRay done which is concerning for sigmoid volvulus. After 1.5L fluids SBP improved to 105.  Today she appears to be resting comfortably in bed, mumbles but is minimal verbal and incoherent. Son Joan Mathis is at the bedside. States he's known her to be communicative and responsive but that the few facilities they've had her in lately have all indicated that they have issues getting her to communicate with them. She is unable to answer subjective questions at this time, thereby limiting HPI and ROS.  Past Medical History  Diagnosis Date  . Atherosclerotic cardiovascular disease   . Hypertension   . Dementia   . FH: dementia   . Acid reflux   . Hypercholesteremia   . Blindness     legal blindness,left eye,histor  . H/O: stroke     ocular stroke,   . Postmenopausal   . Trochanteric bursitis   . Dysphagia   . Hyperlipemia   . Atrial fibrillation (HCC)   . Arthritis   . Seizures (HCC)   . Stroke [facility](HCC)     tia per nursing facilities    History reviewed. No pertinent past surgical  history.  Prior to Admission medications   Medication Sig Start Date End Date Taking? Authorizing Provider  acetaminophen (TYLENOL) 325 MG tablet Take 650 mg by mouth every 6 (six) hours as needed.   Yes Historical Provider, MD  aspirin 81 MG EC tablet Take 4 tablets (325 mg total) by mouth at bedtime. Swallow whole. Patient taking differently: Take 81 mg by mouth at bedtime. Swallow whole. 04/05/15  Yes Dorothea OgleIskra M Myers, MD  atorvastatin (LIPITOR) 10 MG tablet Take 1 tablet (10 mg total) by mouth daily at 6 PM. 04/05/15  Yes Dorothea OgleIskra M Myers, MD  cyanocobalamin 2000 MCG tablet Take 2,000 mcg by mouth daily.   Yes Historical Provider, MD  Eslicarbazepine Acetate 400 MG TABS Take 1 tablet by mouth daily.   Yes Historical Provider, MD  levETIRAcetam (KEPPRA) 500 MG tablet Take 500 mg by mouth 2 (two) times daily.   Yes Historical Provider, MD  lisinopril (PRINIVIL,ZESTRIL) 20 MG tablet Take 20 mg by mouth daily.   Yes Historical Provider, MD  metoprolol tartrate (LOPRESSOR) 25 MG tablet Take 25 mg by mouth daily.    Yes Historical Provider, MD  omeprazole (PRILOSEC) 20 MG capsule Take 20 mg by mouth daily.   Yes Historical Provider, MD  potassium chloride SA (K-DUR,KLOR-CON) 20 MEQ tablet Take 20 mEq by mouth 3 (three) times daily.   Yes Historical Provider, MD  ibuprofen (ADVIL,MOTRIN) 400 MG tablet Take 1 tablet (400 mg total) by mouth 2 (two)  times daily. Patient not taking: Reported on 02/12/2016 10/03/15   Derwood Kaplan, MD    Current Facility-Administered Medications  Medication Dose Route Frequency Provider Last Rate Last Dose  . sodium chloride 0.9 % bolus 1,000 mL  1,000 mL Intravenous Once Glynn Octave, MD 500 mL/hr at 02/12/16 1500 1,000 mL at 02/12/16 1500   Current Outpatient Prescriptions  Medication Sig Dispense Refill  . acetaminophen (TYLENOL) 325 MG tablet Take 650 mg by mouth every 6 (six) hours as needed.    Marland Kitchen aspirin 81 MG EC tablet Take 4 tablets (325 mg total) by mouth at  bedtime. Swallow whole. (Patient taking differently: Take 81 mg by mouth at bedtime. Swallow whole.) 30 tablet 0  . atorvastatin (LIPITOR) 10 MG tablet Take 1 tablet (10 mg total) by mouth daily at 6 PM. 30 tablet 0  . cyanocobalamin 2000 MCG tablet Take 2,000 mcg by mouth daily.    . Eslicarbazepine Acetate 400 MG TABS Take 1 tablet by mouth daily.    Marland Kitchen levETIRAcetam (KEPPRA) 500 MG tablet Take 500 mg by mouth 2 (two) times daily.    Marland Kitchen lisinopril (PRINIVIL,ZESTRIL) 20 MG tablet Take 20 mg by mouth daily.    . metoprolol tartrate (LOPRESSOR) 25 MG tablet Take 25 mg by mouth daily.     Marland Kitchen omeprazole (PRILOSEC) 20 MG capsule Take 20 mg by mouth daily.    . potassium chloride SA (K-DUR,KLOR-CON) 20 MEQ tablet Take 20 mEq by mouth 3 (three) times daily.    Marland Kitchen ibuprofen (ADVIL,MOTRIN) 400 MG tablet Take 1 tablet (400 mg total) by mouth 2 (two) times daily. (Patient not taking: Reported on 02/12/2016) 14 tablet 0    Allergies as of 02/12/2016  . (No Known Allergies)    No family history on file.  Social History   Social History  . Marital Status: Widowed    Spouse Name: N/A  . Number of Children: N/A  . Years of Education: N/A   Occupational History  . Not on file.   Social History Main Topics  . Smoking status: Never Smoker   . Smokeless tobacco: Not on file  . Alcohol Use: No  . Drug Use: No  . Sexual Activity: Not on file   Other Topics Concern  . Not on file   Social History Narrative    Review of Systems: Limited by patient state.  Physical Exam: Vital signs in last 24 hours: Temp:  [96.4 F (35.8 C)] 96.4 F (35.8 C) (07/17 1338) Pulse Rate:  [72-93] 73 (07/17 1430) Resp:  [18-23] 18 (07/17 1430) BP: (58-109)/(42-70) 109/70 mmHg (07/17 1430) SpO2:  [78 %-100 %] 98 % (07/17 1430) Weight:  [135 lb (61.236 kg)] 135 lb (61.236 kg) (07/17 1338)   General:   Elderly thin female in NAD. Opens eyes to verbal and tactile stimuli occasionally. Head:  Normocephalic and  atraumatic. Eyes:  Sclera clear, no icterus. Conjunctiva pink. Ears:  Normal auditory acuity. Lungs:  Clear throughout to auscultation.   No wheezes, crackles, or rhonchi. No acute distress. Heart:  Regular rate and rhythm; no murmurs, clicks, rubs,  or gallops. Abdomen:  Somewhat firm and mildly distended, does not appear to be tender on palation. Noted tympany. Normal to hypoactive bowel sounds, without guarding, and without rebound.   Rectal:  Deferred.   Extremities:  Without clubbing or edema. Neurologic:  Sleeping, non-verbal to incoherent verbal.  Intake/Output from previous day:   Intake/Output this shift:    Lab Results: No results for input(s):  WBC, HGB, HCT, PLT in the last 72 hours. BMET No results for input(s): NA, K, CL, CO2, GLUCOSE, BUN, CREATININE, CALCIUM in the last 72 hours. LFT No results for input(s): PROT, ALBUMIN, AST, ALT, ALKPHOS, BILITOT, BILIDIR, IBILI in the last 72 hours. PT/INR No results for input(s): LABPROT, INR in the last 72 hours. Hepatitis Panel No results for input(s): HEPBSAG, HCVAB, HEPAIGM, HEPBIGM in the last 72 hours. C-Diff No results for input(s): CDIFFTOX in the last 72 hours.  Studies/Results: Dg Chest Portable 1 View  02/12/2016  CLINICAL DATA:  82 year old female with history of altered mental status today. EXAM: PORTABLE CHEST 1 VIEW COMPARISON:  Chest x-ray 06/18/2015. FINDINGS: Skin fold projecting over the mid to lower left hemithorax. Lung volumes are low. No consolidative airspace disease. No pleural effusions. No evidence of pulmonary edema. Heart size is normal. The patient is rotated to the right on today's exam, resulting in distortion of the mediastinal contours and reduced diagnostic sensitivity and specificity for mediastinal pathology. Atherosclerosis in the thoracic aorta. IMPRESSION: 1. Low lung volumes without radiographic evidence of acute cardiopulmonary disease. 2. Aortic atherosclerosis. Electronically Signed   By:  Trudie Reed M.D.   On: 02/12/2016 14:19   Dg Abd Portable 1v  02/12/2016  CLINICAL DATA:  Altered mental status EXAM: PORTABLE ABDOMEN - 1 VIEW COMPARISON:  None. FINDINGS: Dilated loop of sigmoid colon. While nonspecific, this appearance does raise concern for sigmoid volvulus. IMPRESSION: Dilated loop of sigmoid colon, nonspecific but raising concern for sigmoid volvulus. Consider CT abdomen pelvis for further evaluation as clinically warranted. Electronically Signed   By: Charline Bills M.D.   On: 02/12/2016 14:22    Impression: 82 year old female with altered mental status found to be hypotensive with systolic BP in the 50s on arrival and mildly distended abdomen, no fever, no apparent signs of pain. Most labs pending. Abdominal XRay images reviewed and appears consistent with sigmoid volvulus with large, dilated air-filled sigmoid colon in a U-shape extending up to the transverse colon and elevating the mid-transverse colon to the level of the diaphragm, no obvious air in the rectum. Patients vitals have improved after bolus with SBP in the 120s while I was in the room. Remains AFebrile. Other pertinent exam findings include mildly distended abdomen, tympanic on percussion.  Most labs still pending but iSTAT Chem 8 with K 3.3 and glucose 138, otherwise normal. Troponin negative. Lactic acid 4.24. Preliminary CBC with H/H 12.2/36.8, WBC pending. PT/INR pending.  Son is agreeable to colonoscopy/flex sigmoidoscopy if it could help; He is wanting the least invasive option if possible. Son is at bedside and available for discussion.  Plan: 1. Discuss with Dr. Jena Gauss for possible TCS/Flex Sig 2. Monitor for labs and WBC 3. Follow vitals 4. Supportive measures 5. Further recommendations to follow.    Wynne Dust, AGNP-C Adult & Gerontological Nurse Practitioner [facility]        02/12/2016, 3:08 PM

## 2016-02-12 NOTE — H&P (Signed)
TRH H&P   Patient Demographics:    Joan Mathis, is a 82 y.o. female  MRN: [id number]   DOB - 02-19-1926  Admit Date - 02/12/2016  Outpatient Primary MD for the patient is Bernerd Limbo, MD  Referring MD/NP/PA: Glynn Octave  Outpatient Specialists:     Patient coming from: [facility]  Chief Complaint  Patient presents with  . Fatigue      HPI:    Joan Mathis  is a 82 y.o. female, w hypertension, Pafib, CVA, dementia apparently presents from SNF ([facility]) w AMS to ED.   In Ed,  Pt had xray that showed ? Volvulus,  CT scan abd/ pelvis  was negative for volvulus.  GI consulted and following recommended tap water enema.  Pt noted to have ua wbc TNTC.  Received vanco/ zosyn in the ED x 1 dose.  CT brain negative for any acute process.  Pt will be admitted for AMS likely secondary to uti.      Review of systems:    In addition to the HPI above,  No Fever-chills, No Headache, No changes with Vision or hearing, No problems swallowing food or Liquids, No Chest pain, Cough or Shortness of Breath, No Abdominal pain, No Nausea or Vommitting, Bowel movements are regular, No Blood in stool or Urine, No dysuria, No new skin rashes or bruises, No new joints pains-aches,  No new weakness, tingling, numbness in any extremity, No recent weight gain or loss, No polyuria, polydypsia or polyphagia, No significant Mental Stressors.  A full 10 point Review of Systems was done, except as stated above, all other Review of Systems were negative.   With Past History of the following :    Past Medical History  Diagnosis Date  . Atherosclerotic cardiovascular disease   . Hypertension   . Dementia   . FH: dementia   . Acid reflux   . Hypercholesteremia   . Blindness     legal blindness,left eye,histor  . H/O: stroke     ocular stroke,   . Postmenopausal   . Trochanteric  bursitis   . Dysphagia   . Hyperlipemia   . Atrial fibrillation (HCC)   . Arthritis   . Seizures (HCC)   . Stroke [facility])     tia per nursing facilities      History reviewed. No pertinent past surgical history. Unable to obtain Pshx due to dementia   Social History:     Social History  Substance Use Topics  . Smoking status: Never Smoker   . Smokeless tobacco: Not on file  . Alcohol Use: No     Lives - at [facility]  Mobility -      Family History :    History reviewed. No pertinent family history. Unable to obtain Fhx. Due to dementia   Home Medications:   Prior to Admission medications   Medication Sig  Start Date End Date Taking? Authorizing Provider  acetaminophen (TYLENOL) 325 MG tablet Take 650 mg by mouth every 6 (six) hours as needed.   Yes Historical Provider, MD  aspirin 81 MG EC tablet Take 4 tablets (325 mg total) by mouth at bedtime. Swallow whole. Patient taking differently: Take 81 mg by mouth at bedtime. Swallow whole. 04/05/15  Yes Dorothea OgleIskra M Myers, MD  atorvastatin (LIPITOR) 10 MG tablet Take 1 tablet (10 mg total) by mouth daily at 6 PM. 04/05/15  Yes Dorothea OgleIskra M Myers, MD  cyanocobalamin 2000 MCG tablet Take 2,000 mcg by mouth daily.   Yes Historical Provider, MD  Eslicarbazepine Acetate 400 MG TABS Take 1 tablet by mouth daily.   Yes Historical Provider, MD  levETIRAcetam (KEPPRA) 500 MG tablet Take 500 mg by mouth 2 (two) times daily.   Yes Historical Provider, MD  lisinopril (PRINIVIL,ZESTRIL) 20 MG tablet Take 20 mg by mouth daily.   Yes Historical Provider, MD  metoprolol tartrate (LOPRESSOR) 25 MG tablet Take 25 mg by mouth daily.    Yes Historical Provider, MD  omeprazole (PRILOSEC) 20 MG capsule Take 20 mg by mouth daily.   Yes Historical Provider, MD  potassium chloride SA (K-DUR,KLOR-CON) 20 MEQ tablet Take 20 mEq by mouth 3 (three) times daily.   Yes Historical Provider, MD  ibuprofen (ADVIL,MOTRIN) 400 MG tablet Take 1 tablet (400 mg total) by mouth  2 (two) times daily. Patient not taking: Reported on 02/12/2016 10/03/15   Derwood KaplanAnkit Nanavati, MD     Allergies:    No Known Allergies   Physical Exam:   Vitals  Blood pressure 129/74, pulse 73, temperature 98.5 F (36.9 C), temperature source Axillary, resp. rate 13, height 5\' 6"  (1.676 m), weight 61.236 kg (135 lb), SpO2 94 %.   1. General  lying in bed in NAD,    2. Normal affect and insight, Not Suicidal or Homicidal, Awake Alert, Oriented X1.  3. No F.N deficits, ALL C.Nerves Intact, Strength 5/5 all 4 extremities, Sensation intact all 4 extremities, Plantars down going.  4. Ears and Eyes appear Normal, Conjunctivae clear, PERRLA. Moist Oral Mucosa.  5. Supple Neck, No JVD, No cervical lymphadenopathy appriciated, No Carotid Bruits.  6. Symmetrical Chest wall movement, Good air movement bilaterally, CTAB.  7. RRR, No Gallops, Rubs or Murmurs, No Parasternal Heave.  8. Positive Bowel Sounds, Abdomen Soft, No tenderness, No organomegaly appriciated,No rebound -guarding or rigidity.  9.  No Cyanosis, Normal Skin Turgor, No Skin Rash or Bruise.  10. Good muscle tone,  joints appear normal , no effusions, Normal ROM.  11. No Palpable Lymph Nodes in Neck or Axillae    Data Review:    CBC  Recent Labs Lab 02/12/16 1441 02/12/16 1502  WBC 10.5  --   HGB 12.2 12.9  HCT 36.8 38.0  PLT PLATELET CLUMPS NOTED ON SMEAR  --   MCV 90.6  --   MCH 30.0  --   MCHC 33.2  --   RDW 14.8  --   LYMPHSABS 1.9  --   MONOABS 0.5  --   EOSABS 0.0  --   BASOSABS 0.0  --    ------------------------------------------------------------------------------------------------------------------  Chemistries   Recent Labs Lab 02/12/16 1441 02/12/16 1502  NA 138 141  K 3.2* 3.3*  CL 104 101  CO2 25  --   GLUCOSE 141* 138*  BUN 12 11  CREATININE 1.04* 1.00  CALCIUM 8.5*  --   AST 31  --  ALT 15  --   ALKPHOS 95  --   BILITOT 0.9  --     ------------------------------------------------------------------------------------------------------------------ estimated creatinine clearance is 36.4 mL/min (by C-G formula based on Cr of 1). ------------------------------------------------------------------------------------------------------------------ No results for input(s): TSH, T4TOTAL, T3FREE, THYROIDAB in the last 72 hours.  Invalid input(s): FREET3  Coagulation profile  Recent Labs Lab 02/12/16 1441  INR 1.25   ------------------------------------------------------------------------------------------------------------------- No results for input(s): DDIMER in the last 72 hours. -------------------------------------------------------------------------------------------------------------------  Cardiac Enzymes  Recent Labs Lab 02/12/16 1441  TROPONINI 0.04*   ------------------------------------------------------------------------------------------------------------------ No results found for: BNP   ---------------------------------------------------------------------------------------------------------------  Urinalysis    Component Value Date/Time   COLORURINE YELLOW 02/12/2016 1804   APPEARANCEUR CLEAR 02/12/2016 1804   LABSPEC 1.025 02/12/2016 1804   PHURINE 5.5 02/12/2016 1804   GLUCOSEU NEGATIVE 02/12/2016 1804   HGBUR SMALL* 02/12/2016 1804   BILIRUBINUR NEGATIVE 02/12/2016 1804   KETONESUR NEGATIVE 02/12/2016 1804   PROTEINUR 30* 02/12/2016 1804   UROBILINOGEN 2.0* 04/02/2015 1945   NITRITE POSITIVE* 02/12/2016 1804   LEUKOCYTESUR SMALL* 02/12/2016 1804    ----------------------------------------------------------------------------------------------------------------   Imaging Results:    Ct Abdomen Pelvis Wo Contrast  02/12/2016  CLINICAL DATA:  Altered mental status, dilated colon on x-ray. Dementia. EXAM: CT ABDOMEN AND PELVIS WITHOUT CONTRAST TECHNIQUE: Multidetector CT imaging of the  abdomen and pelvis was performed following the standard protocol without IV contrast. COMPARISON:  Abdomen plain film from earlier same day. FINDINGS: Lower chest: Small consolidations at each lung base, most likely atelectasis. Hepatobiliary: 1.1 and 1 cm hypodense lesions within the upper right liver lobe, too small to definitively characterize but most likely benign cysts. Liver otherwise unremarkable. Gallbladder appears normal. No bile duct dilatation seen. Pancreas: No mass or inflammatory process identified on this un-enhanced exam. Spleen: Within normal limits in size. Adrenals/Urinary Tract: Adrenal glands appear normal. Hyperdense mass exophytic to the posterior cortex of the right kidney measures 1.5 cm, compatible with hemorrhagic cyst or solid mass. No right-sided renal stone or hydronephrosis. Left kidney appears normal without mass, stone or hydronephrosis Stomach/Bowel: Prominently distended gas-filled sigmoid colon. More proximal colon is also mildly distended with air and stool. Moderate amount of stool within the rectal vault. No evidence of acute obstruction at the level of the sigmoid colon. No sigmoid volvulus. Small bowel is normal in caliber throughout. No evidence of bowel wall thickening or bowel wall inflammation. Appendix is not seen but there are no inflammatory changes about the cecum to suggest acute appendicitis. Vascular/Lymphatic: Atherosclerotic changes are seen throughout the abdominal aorta. No enlarged lymph nodes seen within the abdomen or pelvis. Reproductive: No mass or other significant abnormality. Other: No free fluid or abscess collection seen. Mild fluid stranding within the presacral space is of uncertain significance, perhaps reactive given the adjacent sigmoid colon distension. No free intraperitoneal air. Musculoskeletal: Degenerative changes are seen throughout the scoliotic thoracolumbar spine, moderate in degree. No acute or suspicious osseous finding. Superficial  soft tissues are unremarkable. IMPRESSION: 1. No sigmoid volvulus. No evidence of acute large bowel or small bowel obstruction. 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 There is a moderate amount of stool and fluid within the rectal vault (partial impaction? ). 2. Hyperdense mass exophytic to the posterior cortex of the right kidney, measuring 1.5 cm, difficult to characterize without intravascular contrast. Solid mass cannot be excluded but favor benign hemorrhagic cyst. 3. Probable liver  cysts, as described above. 4. Aortic atherosclerosis. Electronically Signed   By: Bary Richard M.D.   On: 02/12/2016 18:19   Ct Head Wo Contrast  02/12/2016  CLINICAL DATA:  Altered mental status. History of seizures, stroke, dementia. EXAM: CT HEAD WITHOUT CONTRAST TECHNIQUE: Contiguous axial images were obtained from the base of the skull through the vertex without intravenous contrast. COMPARISON:  Head CT dated 06/26/2015 and brain MRI dated 06/27/2015. FINDINGS: Brain: Again noted is generalized age related parenchymal atrophy with commensurate dilatation of the ventricles and sulci. Also again noted are chronic small vessel ischemic changes within the bilateral periventricular and subcortical white matter regions. There is no mass, hemorrhage, edema or other evidence of acute parenchymal abnormality. No extra-axial hemorrhage. Vascular: No hyperdense vessel or unexpected calcification. There are chronic calcified atherosclerotic changes of the large vessels at the skull base. Skull: Negative for fracture or focal lesion. Sinuses/Orbits: No acute findings. Other: None. IMPRESSION: 1. No acute intracranial findings. No intracranial mass, hemorrhage or edema. 2. Atrophy and chronic ischemic changes in the white matter. Electronically Signed   By: Bary Richard M.D.   On: 02/12/2016  18:02   Dg Chest Portable 1 View  02/12/2016  CLINICAL DATA:  82 year old female with history of altered mental status today. EXAM: PORTABLE CHEST 1 VIEW COMPARISON:  Chest x-ray 06/18/2015. FINDINGS: Skin fold projecting over the mid to lower left hemithorax. Lung volumes are low. No consolidative airspace disease. No pleural effusions. No evidence of pulmonary edema. Heart size is normal. The patient is rotated to the right on today's exam, resulting in distortion of the mediastinal contours and reduced diagnostic sensitivity and specificity for mediastinal pathology. Atherosclerosis in the thoracic aorta. IMPRESSION: 1. Low lung volumes without radiographic evidence of acute cardiopulmonary disease. 2. Aortic atherosclerosis. Electronically Signed   By: Trudie Reed M.D.   On: 02/12/2016 14:19   Dg Abd Portable 1v  02/12/2016  CLINICAL DATA:  Altered mental status EXAM: PORTABLE ABDOMEN - 1 VIEW COMPARISON:  None. FINDINGS: Dilated loop of sigmoid colon. While nonspecific, this appearance does raise concern for sigmoid volvulus. IMPRESSION: Dilated loop of sigmoid colon, nonspecific but raising concern for sigmoid volvulus. Consider CT abdomen pelvis for further evaluation as clinically warranted. Electronically Signed   By: Charline Bills M.D.   On: 02/12/2016 14:22   EKG:  NSR at 90, nl axis, early R progression, no st-t changes    Assessment & Plan:    Active Problems:   Essential hypertension, benign   Sigmoid volvulus (HCC)   Hypotension   Abdominal pain   Altered mental status    1. AMS secondary to uti  2. uti Rocephin 1gm iv qday  3. No sigmoid volvulus Appreciate GI input.   4. Hypotension secondary to dehydration, uti Tele, check trop i q6h x3 tx with NS iv  5. + trop  Tele Check trop i q6h x3.  Check cardiac echo  6. Hyperdense mass R kidney likely exophytic cyst  7. Fecal impaction Agree with tap water enema, appreciate GI input  8.  Hypokalemia Replete Check cmp in am  9.  Protein calorie malnutrition prostat   DVT Prophylaxis Lovenox - SCDs   AM Labs Ordered, also please review Full Orders  Family Communication: Admission, patients condition and plan of care including tests being ordered have been discussed with the patient  who indicate understanding and agree with the plan and Code Status.  Code Status DNR  Likely DC to  SNF  Condition  GUARDED    Consults called: PT, social work, GI  Admission status: observation  Time spent in minutes : 45 minutes   Pearson Grippe M.D on 02/12/2016 at 11:48 PM  Between 7am to 7pm - Pager - [phone] . After 7pm go to [email or web address] - password [facility]  [facility] - Office  [phone]

## 2016-02-13 ENCOUNTER — Observation Stay (HOSPITAL_BASED_OUTPATIENT_CLINIC_OR_DEPARTMENT_OTHER): Payer: Medicare Other

## 2016-02-13 DIAGNOSIS — I959 Hypotension, unspecified: Secondary | ICD-10-CM

## 2016-02-13 DIAGNOSIS — R4182 Altered mental status, unspecified: Secondary | ICD-10-CM

## 2016-02-13 DIAGNOSIS — R7989 Other specified abnormal findings of blood chemistry: Secondary | ICD-10-CM

## 2016-02-13 DIAGNOSIS — I9589 Other hypotension: Secondary | ICD-10-CM

## 2016-02-13 DIAGNOSIS — R1084 Generalized abdominal pain: Secondary | ICD-10-CM

## 2016-02-13 DIAGNOSIS — E860 Dehydration: Secondary | ICD-10-CM

## 2016-02-13 DIAGNOSIS — I10 Essential (primary) hypertension: Secondary | ICD-10-CM

## 2016-02-13 LAB — COMPREHENSIVE METABOLIC PANEL
ALK PHOS: 72 U/L (ref 38–126)
ALT: 11 U/L — ABNORMAL LOW (ref 14–54)
ANION GAP: 4 — AB (ref 5–15)
AST: 19 U/L (ref 15–41)
Albumin: 2.3 g/dL — ABNORMAL LOW (ref 3.5–5.0)
BUN: 15 mg/dL (ref 6–20)
CALCIUM: 7.6 mg/dL — AB (ref 8.9–10.3)
CO2: 27 mmol/L (ref 22–32)
Chloride: 108 mmol/L (ref 101–111)
Creatinine, Ser: 0.8 mg/dL (ref 0.44–1.00)
Glucose, Bld: 86 mg/dL (ref 65–99)
Potassium: 3 mmol/L — ABNORMAL LOW (ref 3.5–5.1)
SODIUM: 139 mmol/L (ref 135–145)
TOTAL PROTEIN: 5 g/dL — AB (ref 6.5–8.1)
Total Bilirubin: 0.6 mg/dL (ref 0.3–1.2)

## 2016-02-13 LAB — CBC
HCT: 30.5 % — ABNORMAL LOW (ref 36.0–46.0)
HEMOGLOBIN: 10.3 g/dL — AB (ref 12.0–15.0)
MCH: 30.3 pg (ref 26.0–34.0)
MCHC: 33.8 g/dL (ref 30.0–36.0)
MCV: 89.7 fL (ref 78.0–100.0)
Platelets: 182 10*3/uL (ref 150–400)
RBC: 3.4 MIL/uL — ABNORMAL LOW (ref 3.87–5.11)
RDW: 14.9 % (ref 11.5–15.5)
WBC: 6.8 10*3/uL (ref 4.0–10.5)

## 2016-02-13 LAB — ECHOCARDIOGRAM COMPLETE
HEIGHTINCHES: 66 in
WEIGHTICAEL: 2232.82 [oz_av]

## 2016-02-13 LAB — TROPONIN I
TROPONIN I: 0.12 ng/mL — AB (ref <0.03)
TROPONIN I: 0.16 ng/mL — AB (ref <0.03)
Troponin I: 0.11 ng/mL (ref <0.03)

## 2016-02-13 LAB — MAGNESIUM: MAGNESIUM: 1.4 mg/dL — AB (ref 1.7–2.4)

## 2016-02-13 MED ORDER — ATORVASTATIN CALCIUM 10 MG PO TABS: 10.0000 mg | ORAL_TABLET | Freq: Every day | ORAL | Status: DC

## 2016-02-13 MED ORDER — LEVETIRACETAM 500 MG PO TABS
500.0000 mg | ORAL_TABLET | Freq: Two times a day (BID) | ORAL | Status: DC
  Administered 2016-02-13 – 2016-02-15 (×6): 500 mg via ORAL
  Filled 2016-02-13 (×6): qty 1

## 2016-02-13 MED ORDER — POTASSIUM CHLORIDE CRYS ER 20 MEQ PO TBCR
20.0000 meq | EXTENDED_RELEASE_TABLET | Freq: Three times a day (TID) | ORAL | Status: DC
  Administered 2016-02-13 – 2016-02-15 (×7): 20 meq via ORAL
  Filled 2016-02-13 (×8): qty 1

## 2016-02-13 MED ORDER — PANTOPRAZOLE SODIUM 40 MG PO TBEC
40.0000 mg | DELAYED_RELEASE_TABLET | Freq: Every day | ORAL | Status: DC
  Administered 2016-02-13: 40 mg via ORAL
  Filled 2016-02-13: qty 1

## 2016-02-13 MED ORDER — SODIUM CHLORIDE 0.9% FLUSH
3.0000 mL | Freq: Two times a day (BID) | INTRAVENOUS | Status: DC
  Administered 2016-02-13 – 2016-02-15 (×5): 3 mL via INTRAVENOUS

## 2016-02-13 MED ORDER — ESLICARBAZEPINE ACETATE 400 MG PO TABS
1.0000 | ORAL_TABLET | Freq: Every day | ORAL | Status: DC
  Administered 2016-02-14 – 2016-02-15 (×2): 1 via ORAL
  Filled 2016-02-13 (×3): qty 1

## 2016-02-13 MED ORDER — POTASSIUM CHLORIDE IN NACL 20-0.9 MEQ/L-% IV SOLN
INTRAVENOUS | Status: AC
  Administered 2016-02-13: 100 mL via INTRAVENOUS
  Administered 2016-02-13: 10:00:00 via INTRAVENOUS

## 2016-02-13 MED ORDER — PERFLUTREN LIPID MICROSPHERE
1.0000 mL | INTRAVENOUS | Status: AC | PRN
  Administered 2016-02-13: 3 mL via INTRAVENOUS
  Filled 2016-02-13: qty 10

## 2016-02-13 MED ORDER — PRO-STAT SUGAR FREE PO LIQD
30.0000 mL | Freq: Two times a day (BID) | ORAL | Status: DC
  Administered 2016-02-13 – 2016-02-15 (×4): 30 mL via ORAL
  Filled 2016-02-13 (×5): qty 30

## 2016-02-13 MED ORDER — SODIUM CHLORIDE 0.9 % IV SOLN
INTRAVENOUS | Status: DC
  Administered 2016-02-13 – 2016-02-14 (×2): via INTRAVENOUS

## 2016-02-13 MED ORDER — ENOXAPARIN SODIUM 40 MG/0.4ML ~~LOC~~ SOLN
40.0000 mg | SUBCUTANEOUS | Status: DC
  Administered 2016-02-13 – 2016-02-14 (×2): 40 mg via SUBCUTANEOUS
  Filled 2016-02-13 (×2): qty 0.4

## 2016-02-13 MED ORDER — METOPROLOL TARTRATE 25 MG PO TABS
25.0000 mg | ORAL_TABLET | Freq: Every day | ORAL | Status: DC
  Administered 2016-02-13 – 2016-02-15 (×2): 25 mg via ORAL
  Filled 2016-02-13 (×3): qty 1

## 2016-02-13 MED ORDER — POTASSIUM CHLORIDE 10 MEQ/100ML IV SOLN
10.0000 meq | INTRAVENOUS | Status: AC
  Administered 2016-02-13 (×2): 10 meq via INTRAVENOUS
  Filled 2016-02-13 (×2): qty 100

## 2016-02-13 MED ORDER — DEXTROSE 5 % IV SOLN
INTRAVENOUS | Status: AC
  Filled 2016-02-13: qty 10

## 2016-02-13 MED ORDER — ACETAMINOPHEN 650 MG RE SUPP: 650.0000 mg | Freq: Four times a day (QID) | RECTAL | Status: DC | PRN

## 2016-02-13 MED ORDER — PANTOPRAZOLE SODIUM 40 MG PO TBEC
40.0000 mg | DELAYED_RELEASE_TABLET | Freq: Every day | ORAL | Status: DC
  Administered 2016-02-14 – 2016-02-15 (×2): 40 mg via ORAL
  Filled 2016-02-13 (×2): qty 1

## 2016-02-13 MED ORDER — BOOST HIGH PROTEIN PO LIQD
237.0000 mL | Freq: Three times a day (TID) | ORAL | Status: DC
  Administered 2016-02-14 – 2016-02-15 (×5): 237 mL via ORAL
  Filled 2016-02-13 (×9): qty 237

## 2016-02-13 MED ORDER — ACETAMINOPHEN 325 MG PO TABS: 650.0000 mg | ORAL_TABLET | Freq: Four times a day (QID) | ORAL | Status: DC | PRN

## 2016-02-13 MED ORDER — MILK AND MOLASSES ENEMA
1.0000 | Freq: Once | RECTAL | Status: AC
  Administered 2016-02-13: 250 mL via RECTAL

## 2016-02-13 MED ORDER — ASPIRIN EC 81 MG PO TBEC
81.0000 mg | DELAYED_RELEASE_TABLET | Freq: Every day | ORAL | Status: DC
  Administered 2016-02-13: 81 mg via ORAL
  Filled 2016-02-13: qty 1

## 2016-02-13 MED ORDER — POTASSIUM CHLORIDE 10 MEQ/100ML IV SOLN
10.0000 meq | INTRAVENOUS | Status: AC
  Administered 2016-02-13 – 2016-02-14 (×3): 10 meq via INTRAVENOUS
  Filled 2016-02-13 (×2): qty 100

## 2016-02-13 NOTE — Progress Notes (Addendum)
*  PRELIMINARY RESULTS* Echocardiogram 2D Echocardiogram has been performed with Definity.  Stacey DrainWhite, Mirage Pfefferkorn J 02/13/2016, 12:49 PM

## 2016-02-13 NOTE — Progress Notes (Signed)
Initial Nutrition Assessment  DOCUMENTATION CODES:  Not applicable  INTERVENTION:  Monitor for diet advancement and will implement intervention if PO intake is Poor.   NUTRITION DIAGNOSIS:  Inadequate oral intake related to altered GI function as evidenced by NPO status.  GOAL:  Patient will meet greater than or equal to 90% of their needs  MONITOR:  Diet advancement, PO intake, Labs  REASON FOR ASSESSMENT:  Malnutrition Screening Tool    ASSESSMENT:  82 y/o female PMHx htn, afib, cva, hld, dysphagia, cad, dementia presents with AMS. Worked up for UTI and questionable volvulus, but now now thought potential impaction.   Pt is pleasantly confused. Unable to offer any history. Son apparently had just left and would not return until tomorrow. Pt has not yet had opportunity to eat. Had been NPO due to possible volvulus/impaction . Now Has rectal pouch in place. RN reports pt's impaction has resolved.   Awaiting diet advancement.   Abdomen: soft, non distended  No muscle/fat wasting  Question weight history. Per [facility] EMR documentation, pt appears to have lost 25 lbs (15% bw) in the last 10 months. This is not significant for timeframe.  However, Per Care Everywhere, pt was weighed at 167 lbs on 3/29 and 172 on 4/23 at outside facilities.   Labs reviewed: Albumin: 2.3, WBC: 6.8, Total Pro:5.0, Calcium: 7.6,   Recent Labs Lab 02/12/16 1441 02/12/16 1502 02/13/16 0814  NA 138 141 139  K 3.2* 3.3* 3.0*  CL 104 101 108  CO2 25  --  27  BUN 12 11 15   CREATININE 1.04* 1.00 0.80  CALCIUM 8.5*  --  7.6*  GLUCOSE 141* 138* 86   Diet Order:  Diet NPO time specified Except for: Ice Chips, Sips with Meds  Skin:  Reviewed, no issues  Last BM:  7/18- watery   Height:  Ht Readings from Last 1 Encounters:  02/12/16 5\' 6"  (1.676 m)   Weight:  Wt Readings from Last 1 Encounters:  02/13/16 139 lb 8.8 oz (63.3 kg)   Wt Readings from Last 10 Encounters:  02/13/16 139 lb 8.8 oz  (63.3 kg)  04/05/15 158 lb 9.6 oz (71.94 kg)  05/10/10 150 lb (68.04 kg)  Admit weight: 135  Ideal Body Weight:  59.1 kg  BMI:  Body mass index is 22.53 kg/(m^2).  Estimated Nutritional Needs:  Kcal:  1500-1700 (24-27 kcal/kg bw) Protein:  63-76 (1-1.2 g/kg bw) Fluid:  >1.5 liters  EDUCATION NEEDS:  No education needs identified at this time  Christophe LouisNathan Franks RD, LDN, CNSC Clinical Nutrition Pager: [phone] 02/13/2016 1:45 PM

## 2016-02-13 NOTE — Progress Notes (Signed)
Patient seen and examined, database reviewed. Admitted earlier today for acute encephalopathy from [facility]. There was a question of a sigmoid volvulus on plain film, but CT did not confirm. There is distention of sigmoid colon with concern for fecal impaction. GI recommends keeping rectal tube in and repeat plain film in am. Will also give MOM enema. Also found to have a UTI and remains on rocephin pending cx data. Is hypokalemic, so will replete IV and check Mg level. BP has stabilized. Troponin elevation is flat and likely represents demand ischemia from hypotension. ECHO in 9/16 with normal EF; no acute ischemic changes on EKG.Ok for transfer to floor. Will continue to follow.  Peggye PittEstela Hernandez, MD [facility] Pager: [phone]

## 2016-02-13 NOTE — Progress Notes (Signed)
Pt transferred to medical surgical with RN and NT per MD order. Report given to Verlon AuLeslie, [place]. IV infusing as ordered. O2 sat 96% maintained on room air. Son, BerwickDanny, at bedside

## 2016-02-13 NOTE — Progress Notes (Signed)
Spoke with patient's son, Dannielle HuhDanny, re: patient's medication Esilcarbazepine acetate, as hospital is unable to obtain medication. [facility] is [facility] to obtain medication [facility]

## 2016-02-13 NOTE — Care Management Obs Status (Signed)
MEDICARE OBSERVATION STATUS NOTIFICATION   Patient Details  Name: Joan Mathis MRN: [id number] Date of Birth: [birth year]   Medicare Observation Status Notification Given:  Yes  Patient not able to sign for self.  POA at bedside was able to sign for patient.    Fuller PlanWelborn, Marico Buckle M, RN 02/13/2016, 1:56 PM

## 2016-02-13 NOTE — Progress Notes (Signed)
Subjective: Patient seems a little more alert today. Awakens to vocal and tactile stimulus. Minatained eye contact and answered questions, although indecipherably. Continued to mumble during duration of exam. Said "no" when asked if any abdominal pain. No other subjective information able to be elicited due to patient AMS status.  Objective: Vital signs in last 24 hours: Temp:  [96.4 F (35.8 C)-98.7 F (37.1 C)] 97 F (36.1 C) (07/18 0747) Pulse Rate:  [51-93] 62 (07/18 0500) Resp:  [13-23] 15 (07/18 0500) BP: (58-165)/(42-134) 156/84 mmHg (07/18 0600) SpO2:  [78 %-100 %] 95 % (07/18 0500) Weight:  [135 lb (61.236 kg)-139 lb 8.8 oz (63.3 kg)] 139 lb 8.8 oz (63.3 kg) (07/18 0500) Last BM Date: 02/12/16 General:   Alert and pleasant Head:  Normocephalic and atraumatic. Eyes:  No icterus, sclera clear. Conjuctiva pink.  Heart:  S1, S2 present, no murmurs noted.  Lungs: Clear to auscultation bilaterally, without wheezing, rales, or rhonchi.  Abdomen:  Bowel sounds present, soft, apparently non-tender, non-distended. No rebound or guarding. Rectal tube intact with soft brown stool in the proximal tube, none in the bag. Msk:  Symmetrical without gross deformities. Extremities:  Without edema. Psych:  Pleasent mood and affect.  Intake/Output from previous day: 07/17 0701 - 07/18 0700 In: 550 [I.V.:500; IV Piggyback:50] Out: -  Intake/Output this shift:    Lab Results:  Recent Labs  02/12/16 1441 02/12/16 1502 02/13/16 0814  WBC 10.5  --  6.8  HGB 12.2 12.9 10.3*  HCT 36.8 38.0 30.5*  PLT PLATELET CLUMPS NOTED ON SMEAR  --  182   BMET  Recent Labs  02/12/16 1441 02/12/16 1502  NA 138 141  K 3.2* 3.3*  CL 104 101  CO2 25  --   GLUCOSE 141* 138*  BUN 12 11  CREATININE 1.04* 1.00  CALCIUM 8.5*  --    LFT  Recent Labs  02/12/16 1441  PROT 6.4*  ALBUMIN 3.1*  AST 31  ALT 15  ALKPHOS 95  BILITOT 0.9   PT/INR  Recent Labs  02/12/16 1441  LABPROT  15.9*  INR 1.25   Hepatitis Panel No results for input(s): HEPBSAG, HCVAB, HEPAIGM, HEPBIGM in the last 72 hours.   Studies/Results: Ct Abdomen Pelvis Wo Contrast  02/12/2016  CLINICAL DATA:  Altered mental status, dilated colon on x-ray. Dementia. EXAM: CT ABDOMEN AND PELVIS WITHOUT CONTRAST TECHNIQUE: Multidetector CT imaging of the abdomen and pelvis was performed following the standard protocol without IV contrast. COMPARISON:  Abdomen plain film from earlier same day. FINDINGS: Lower chest: Small consolidations at each lung base, most likely atelectasis. Hepatobiliary: 1.1 and 1 cm hypodense lesions within the upper right liver lobe, too small to definitively characterize but most likely benign cysts. Liver otherwise unremarkable. Gallbladder appears normal. No bile duct dilatation seen. Pancreas: No mass or inflammatory process identified on this un-enhanced exam. Spleen: Within normal limits in size. Adrenals/Urinary Tract: Adrenal glands appear normal. Hyperdense mass exophytic to the posterior cortex of the right kidney measures 1.5 cm, compatible with hemorrhagic cyst or solid mass. No right-sided renal stone or hydronephrosis. Left kidney appears normal without mass, stone or hydronephrosis Stomach/Bowel: Prominently distended gas-filled sigmoid colon. More proximal colon is also mildly distended with air and stool. Moderate amount of stool within the rectal vault. No evidence of acute obstruction at the level of the sigmoid colon. No sigmoid volvulus. Small bowel is normal in caliber throughout. No evidence of bowel wall thickening or bowel wall inflammation. Appendix  is not seen but there are no inflammatory changes about the cecum to suggest acute appendicitis. Vascular/Lymphatic: Atherosclerotic changes are seen throughout the abdominal aorta. No enlarged lymph nodes seen within the abdomen or pelvis. Reproductive: No mass or other significant abnormality. Other: No free fluid or abscess  collection seen. Mild fluid stranding within the presacral space is of uncertain significance, perhaps reactive given the adjacent sigmoid colon distension. No free intraperitoneal air. Musculoskeletal: Degenerative changes are seen throughout the scoliotic thoracolumbar spine, moderate in degree. No acute or suspicious osseous finding. Superficial soft tissues are unremarkable. IMPRESSION: 1. No sigmoid volvulus. No evidence of acute large bowel or small bowel obstruction. 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 There is a moderate amount of stool and fluid within the rectal vault (partial impaction? ). 2. Hyperdense mass exophytic to the posterior cortex of the right kidney, measuring 1.5 cm, difficult to characterize without intravascular contrast. Solid mass cannot be excluded but favor benign hemorrhagic cyst. 3. Probable liver cysts, as described above. 4. Aortic atherosclerosis. Electronically Signed   By: Bary RichardStan  Maynard M.D.   On: 02/12/2016 18:19   Ct Head Wo Contrast  02/12/2016  CLINICAL DATA:  Altered mental status. History of seizures, stroke, dementia. EXAM: CT HEAD WITHOUT CONTRAST TECHNIQUE: Contiguous axial images were obtained from the base of the skull through the vertex without intravenous contrast. COMPARISON:  Head CT dated 06/26/2015 and brain MRI dated 06/27/2015. FINDINGS: Brain: Again noted is generalized age related parenchymal atrophy with commensurate dilatation of the ventricles and sulci. Also again noted are chronic small vessel ischemic changes within the bilateral periventricular and subcortical white matter regions. There is no mass, hemorrhage, edema or other evidence of acute parenchymal abnormality. No extra-axial hemorrhage. Vascular: No hyperdense vessel or unexpected calcification. There are chronic calcified atherosclerotic changes  of the large vessels at the skull base. Skull: Negative for fracture or focal lesion. Sinuses/Orbits: No acute findings. Other: None. IMPRESSION: 1. No acute intracranial findings. No intracranial mass, hemorrhage or edema. 2. Atrophy and chronic ischemic changes in the white matter. Electronically Signed   By: Bary RichardStan  Maynard M.D.   On: 02/12/2016 18:02   Dg Chest Portable 1 View  02/12/2016  CLINICAL DATA:  82 year old female with history of altered mental status today. EXAM: PORTABLE CHEST 1 VIEW COMPARISON:  Chest x-ray 06/18/2015. FINDINGS: Skin fold projecting over the mid to lower left hemithorax. Lung volumes are low. No consolidative airspace disease. No pleural effusions. No evidence of pulmonary edema. Heart size is normal. The patient is rotated to the right on today's exam, resulting in distortion of the mediastinal contours and reduced diagnostic sensitivity and specificity for mediastinal pathology. Atherosclerosis in the thoracic aorta. IMPRESSION: 1. Low lung volumes without radiographic evidence of acute cardiopulmonary disease. 2. Aortic atherosclerosis. Electronically Signed   By: Trudie Reedaniel  Entrikin M.D.   On: 02/12/2016 14:19   Dg Abd Portable 1v  02/12/2016  CLINICAL DATA:  Altered mental status EXAM: PORTABLE ABDOMEN - 1 VIEW COMPARISON:  None. FINDINGS: Dilated loop of sigmoid colon. While nonspecific, this appearance does raise concern for sigmoid volvulus. IMPRESSION: Dilated loop of sigmoid colon, nonspecific but raising concern for sigmoid volvulus. Consider CT abdomen pelvis for further evaluation as clinically warranted. Electronically Signed   By: Charline BillsSriyesh  Krishnan M.D.   On: 02/12/2016 14:22    Assessment: 82 year old female with altered mental status  found to be hypotensive with systolic BP in the 50s on arrival and mildly distended abdomen, no fever, no apparent signs of pain. Most labs pending. Abdominal XRay images reviewed and appears consistent with sigmoid volvulus with  large, dilated air-filled sigmoid colon in a U-shape extending up to the transverse colon and elevating the mid-transverse colon to the level of the diaphragm, no obvious air in the rectum. Patients vitals improved after bolus with SBP in the 120s, AFebrile.   ER labs with K 3.3 and glucose 138. Troponin negative. Lactic acid 4.24. CBC with H/H 12.2/36.8, WBC 10.5. INR 1.25. Urine with WBC TNTC, MRSA screen negative. Admitted to ICU for AMS likely UTI-related.  CT abdomen with no sigmoid volvulus, no evidence of small or large bowel obstruction. Noted priminent gaseous distension of the sigmoid colon and mild distension of more proximal colon likely chronic. Moderate stool and fluid in the rectal vault. Query Ogilvie's syndrome vs element of feval impaction vs overlap. No need for flex sig, orders for 2 tap water enemas followed by indwelling rectal tube and moving patient side-to-side q 2 hours. Likely would benefit from Miralax regimen chronically.  Today she seems more alert, babbling during exam, attempting to answer questions although answers generally not understandable. Did answer "no" when asked about abdominal pain. Rectal tube in place with soft stool residual noted in the proximal tube. Per nursing staff, she had received tap water enemas x 2 last night with soft brown stool result.   Vitals remain stable, hypotension resolved.   Plan: 1. Continue indwelling rectal tube 2. Monitor for stool output 3. Supportive measures 4. Notify if any blood in stool, emesis, or other acute changes. 5. Can consider repeat abdominal XRay in the coming days for changed to grossly dilated, air-filled sigmoid colon   Wynne Dust, AGNP-C Adult & Gerontological Nurse Practitioner [facility]    LOS: 1 day    02/13/2016, 8:40 AM

## 2016-02-14 DIAGNOSIS — D6489 Other specified anemias: Secondary | ICD-10-CM

## 2016-02-14 DIAGNOSIS — N2889 Other specified disorders of kidney and ureter: Secondary | ICD-10-CM

## 2016-02-14 DIAGNOSIS — G40909 Epilepsy, unspecified, not intractable, without status epilepticus: Secondary | ICD-10-CM

## 2016-02-14 DIAGNOSIS — K562 Volvulus: Secondary | ICD-10-CM

## 2016-02-14 DIAGNOSIS — D696 Thrombocytopenia, unspecified: Secondary | ICD-10-CM | POA: Diagnosis not present

## 2016-02-14 DIAGNOSIS — I482 Chronic atrial fibrillation, unspecified: Secondary | ICD-10-CM | POA: Diagnosis present

## 2016-02-14 DIAGNOSIS — K5641 Fecal impaction: Secondary | ICD-10-CM

## 2016-02-14 DIAGNOSIS — N390 Urinary tract infection, site not specified: Principal | ICD-10-CM | POA: Diagnosis present

## 2016-02-14 DIAGNOSIS — E876 Hypokalemia: Secondary | ICD-10-CM | POA: Diagnosis present

## 2016-02-14 HISTORY — DX: Other specified disorders of kidney and ureter: N28.89

## 2016-02-14 LAB — BASIC METABOLIC PANEL
ANION GAP: 7 (ref 5–15)
BUN: 15 mg/dL (ref 6–20)
CHLORIDE: 108 mmol/L (ref 101–111)
CO2: 24 mmol/L (ref 22–32)
Calcium: 8.2 mg/dL — ABNORMAL LOW (ref 8.9–10.3)
Creatinine, Ser: 0.58 mg/dL (ref 0.44–1.00)
GFR calc non Af Amer: >60 mL/min (ref >60)
GLUCOSE: 84 mg/dL (ref 65–99)
Potassium: 3.4 mmol/L — ABNORMAL LOW (ref 3.5–5.1)
Sodium: 139 mmol/L (ref 135–145)

## 2016-02-14 LAB — CBC
HEMATOCRIT: 29.5 % — AB (ref 36.0–46.0)
HEMOGLOBIN: 9.8 g/dL — AB (ref 12.0–15.0)
MCH: 29.8 pg (ref 26.0–34.0)
MCHC: 33.2 g/dL (ref 30.0–36.0)
MCV: 89.7 fL (ref 78.0–100.0)
Platelets: 109 10*3/uL — ABNORMAL LOW (ref 150–400)
RBC: 3.29 MIL/uL — ABNORMAL LOW (ref 3.87–5.11)
RDW: 14.9 % (ref 11.5–15.5)
WBC: 6.3 10*3/uL (ref 4.0–10.5)

## 2016-02-14 LAB — URINE CULTURE

## 2016-02-14 LAB — TSH: TSH: 1.115 u[IU]/mL (ref 0.350–4.500)

## 2016-02-14 MED ORDER — POLYETHYLENE GLYCOL 3350 17 G PO PACK
17.0000 g | PACK | Freq: Every day | ORAL | Status: DC
  Administered 2016-02-14 – 2016-02-15 (×2): 17 g via ORAL
  Filled 2016-02-14 (×2): qty 1

## 2016-02-14 MED ORDER — POTASSIUM CHLORIDE IN NACL 20-0.9 MEQ/L-% IV SOLN
INTRAVENOUS | Status: DC
  Administered 2016-02-14: 14:00:00 via INTRAVENOUS

## 2016-02-14 MED ORDER — MAGNESIUM HYDROXIDE 400 MG/5ML PO SUSP
15.0000 mL | Freq: Two times a day (BID) | ORAL | Status: AC
  Administered 2016-02-14 (×2): 15 mL via ORAL
  Filled 2016-02-14 (×2): qty 30

## 2016-02-14 NOTE — Progress Notes (Signed)
Subjective:  Alert but somewhat confused. Answers "no" to having pain.   Objective: Vital signs in last 24 hours: Temp:  [97 F (36.1 C)-99.6 F (37.6 C)] 98.4 F (36.9 C) (07/19 0625) Pulse Rate:  [51-73] 58 (07/19 0625) Resp:  [12-20] 20 (07/19 0625) BP: (109-171)/(42-84) 131/84 mmHg (07/19 0625) SpO2:  [95 %-100 %] 97 % (07/19 0625) Weight:  [141 lb 12.8 oz (64.32 kg)] 141 lb 12.8 oz (64.32 kg) (07/19 0427) Last BM Date: 02/13/16 General:   Alert,  Elderly WF in NAD. Confused Head:  Normocephalic and atraumatic. Eyes:  Sclera clear, no icterus.  Abdomen:  Soft, nontender and nondistended.   Normal bowel sounds, without guarding, and without rebound.   Extremities:  Without clubbing, deformity or edema. Neurologic:  Alert and  Oriented to person. Skin:  Intact without significant lesions or rashes. Psych:  Alert and cooperative. Somewhat agitated.  Intake/Output from previous day:   Intake/Output this shift:    Lab Results: CBC  Recent Labs  02/12/16 1441 02/12/16 1502 02/13/16 0814 02/14/16 0610  WBC 10.5  --  6.8 6.3  HGB 12.2 12.9 10.3* 9.8*  HCT 36.8 38.0 30.5* 29.5*  MCV 90.6  --  89.7 89.7  PLT PLATELET CLUMPS NOTED ON SMEAR  --  182 109*   BMET  Recent Labs  02/12/16 1441 02/12/16 1502 02/13/16 0814 02/14/16 0610  NA 138 141 139 139  K 3.2* 3.3* 3.0* 3.4*  CL 104 101 108 108  CO2 25  --  27 24  GLUCOSE 141* 138* 86 84  BUN CREATININE 1.04* 1.00 0.80 0.58  CALCIUM 8.5*  --  7.6* 8.2*   LFTs  Recent Labs  02/12/16 1441 02/13/16 0814  BILITOT 0.9 0.6  ALKPHOS 95 72  AST 31 19  ALT 15 11*  PROT 6.4* 5.0*  ALBUMIN 3.1* 2.3*   No results for input(s): LIPASE in the last 72 hours. PT/INR  Recent Labs  02/12/16 1441  LABPROT 15.9*  INR 1.25      Imaging Studies: Ct Abdomen Pelvis Wo Contrast  02/12/2016  CLINICAL DATA:  Altered mental status, dilated colon on x-ray. Dementia. EXAM: CT ABDOMEN AND PELVIS WITHOUT  CONTRAST TECHNIQUE: Multidetector CT imaging of the abdomen and pelvis was performed following the standard protocol without IV contrast. COMPARISON:  Abdomen plain film from earlier same day. FINDINGS: Lower chest: Small consolidations at each lung base, most likely atelectasis. Hepatobiliary: 1.1 and 1 cm hypodense lesions within the upper right liver lobe, too small to definitively characterize but most likely benign cysts. Liver otherwise unremarkable. Gallbladder appears normal. No bile duct dilatation seen. Pancreas: No mass or inflammatory process identified on this un-enhanced exam. Spleen: Within normal limits in size. Adrenals/Urinary Tract: Adrenal glands appear normal. Hyperdense mass exophytic to the posterior cortex of the right kidney measures 1.5 cm, compatible with hemorrhagic cyst or solid mass. No right-sided renal stone or hydronephrosis. Left kidney appears normal without mass, stone or hydronephrosis Stomach/Bowel: Prominently distended gas-filled sigmoid colon. More proximal colon is also mildly distended with air and stool. Moderate amount of stool within the rectal vault. No evidence of acute obstruction at the level of the sigmoid colon. No sigmoid volvulus. Small bowel is normal in caliber throughout. No evidence of bowel wall thickening or bowel wall inflammation. Appendix is not seen but there are no inflammatory changes about the cecum to suggest acute appendicitis. Vascular/Lymphatic: Atherosclerotic changes are seen throughout the abdominal aorta. No enlarged lymph  nodes seen within the abdomen or pelvis. Reproductive: No mass or other significant abnormality. Other: No free fluid or abscess collection seen. Mild fluid stranding within the presacral space is of uncertain significance, perhaps reactive given the adjacent sigmoid colon distension. No free intraperitoneal air. Musculoskeletal: Degenerative changes are seen throughout the scoliotic thoracolumbar spine, moderate in degree.  No acute or suspicious osseous finding. Superficial soft tissues are unremarkable. IMPRESSION: 1. No sigmoid volvulus. No evidence of acute large bowel or small bowel obstruction. 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 There is a moderate amount of stool and fluid within the rectal vault (partial impaction? ). 2. Hyperdense mass exophytic to the posterior cortex of the right kidney, measuring 1.5 cm, difficult to characterize without intravascular contrast. Solid mass cannot be excluded but favor benign hemorrhagic cyst. 3. Probable liver cysts, as described above. 4. Aortic atherosclerosis. Electronically Signed   By: Bary RichardStan  Maynard M.D.   On: 02/12/2016 18:19   Ct Head Wo Contrast  02/12/2016  CLINICAL DATA:  Altered mental status. History of seizures, stroke, dementia. EXAM: CT HEAD WITHOUT CONTRAST TECHNIQUE: Contiguous axial images were obtained from the base of the skull through the vertex without intravenous contrast. COMPARISON:  Head CT dated 06/26/2015 and brain MRI dated 06/27/2015. FINDINGS: Brain: Again noted is generalized age related parenchymal atrophy with commensurate dilatation of the ventricles and sulci. Also again noted are chronic small vessel ischemic changes within the bilateral periventricular and subcortical white matter regions. There is no mass, hemorrhage, edema or other evidence of acute parenchymal abnormality. No extra-axial hemorrhage. Vascular: No hyperdense vessel or unexpected calcification. There are chronic calcified atherosclerotic changes of the large vessels at the skull base. Skull: Negative for fracture or focal lesion. Sinuses/Orbits: No acute findings. Other: None. IMPRESSION: 1. No acute intracranial findings. No intracranial mass, hemorrhage or edema. 2. Atrophy and chronic ischemic changes in the white matter. Electronically  Signed   By: Bary RichardStan  Maynard M.D.   On: 02/12/2016 18:02   Dg Chest Portable 1 View  02/12/2016  CLINICAL DATA:  82 year old female with history of altered mental status today. EXAM: PORTABLE CHEST 1 VIEW COMPARISON:  Chest x-ray 06/18/2015. FINDINGS: Skin fold projecting over the mid to lower left hemithorax. Lung volumes are low. No consolidative airspace disease. No pleural effusions. No evidence of pulmonary edema. Heart size is normal. The patient is rotated to the right on today's exam, resulting in distortion of the mediastinal contours and reduced diagnostic sensitivity and specificity for mediastinal pathology. Atherosclerosis in the thoracic aorta. IMPRESSION: 1. Low lung volumes without radiographic evidence of acute cardiopulmonary disease. 2. Aortic atherosclerosis. Electronically Signed   By: Trudie Reedaniel  Entrikin M.D.   On: 02/12/2016 14:19   Dg Abd Portable 1v  02/12/2016  CLINICAL DATA:  Altered mental status EXAM: PORTABLE ABDOMEN - 1 VIEW COMPARISON:  None. FINDINGS: Dilated loop of sigmoid colon. While nonspecific, this appearance does raise concern for sigmoid volvulus. IMPRESSION: Dilated loop of sigmoid colon, nonspecific but raising concern for sigmoid volvulus. Consider CT abdomen pelvis for further evaluation as clinically warranted. Electronically Signed   By: Charline BillsSriyesh  Krishnan M.D.   On: 02/12/2016 14:22  [2 weeks]   Assessment:  82 year old female with altered mental status found to be hypotensive with systolic BP in the 50s on arrival and mildly distended abdomen, no fever, no apparent signs of pain. Abdominal XRay  images reviewed and appears consistent with sigmoid volvulus with large, dilated air-filled sigmoid colon in a U-shape extending up to the transverse colon and elevating the mid-transverse colon to the level of the diaphragm, no obvious air in the rectum. Admitted AMS likely UTI-related.  CT abdomen with no sigmoid volvulus, no evidence of small or large bowel  obstruction. Noted priminent gaseous distension of the sigmoid colon and mild distension of more proximal colon likely chronic. Moderate stool and fluid in the rectal vault. Query Ogilvie's syndrome vs element of feval impaction vs overlap.  Today she is alert. Denies pain but is confused. Abdominal exam benign.   Abnormal CT of right kidney: Hyperdense mass exophytic to the posterior cortex of the right kidney, measuring 1.5 cm, difficult to characterize without intravascular contrast. Solid mass cannot be excluded but favor benign hemorrhagic cyst.  Plan: 1. Add Miralax. 2. Management of renal lesion per attending.  Leanna Battles. Dixon Boos [facility] [phone] 7/19/20179:21 AM     LOS: 2 days

## 2016-02-14 NOTE — Progress Notes (Addendum)
PROGRESS NOTE    Joan Mathis  ZOX:[id number] DOB: 1925/12/02 DOA: 02/12/2016 PCP: Bernerd Limbo, MD    Brief Narrative:  Patient is an 82 year old woman with a history of dementia, strokes, chronic atrial fibrillation-not on anticoagulation, hypertension, and seizure disorder, who presented to the ED from [facility] on 02/12/16 with a report of fatigue and altered mental status. In the ED, she was mildly hypothermic and hemodynamically stable, though one isolated blood pressure showed a systolic blood pressure 58. Her lactic acid was 4.2. Her urinalysis was consistent with UTI. Her troponin I was 0.04. Her potassium was 3.3. CT of her head revealed no acute findings. Abdominal x-ray revealed dilated loop of sigmoid colon raising concern for sigmoid volvulus. She was admitted for further evaluation and management.   Assessment & Plan:   Principal Problem:   Urinary tract infection, site not specified Active Problems:   Fecal impaction (HCC)   Hypotension   Dehydration   Essential hypertension, benign   Abdominal pain   Altered mental status   Hypokalemia   Anemia due to other cause   Chronic atrial fibrillation (HCC)   Thrombocytopenia (HCC)   Seizure disorder (HCC)   1. Urinary tract infection.  Blood cultures and urine culture ordered in the ED. She was started on ceftriaxone. Gentle IV fluids were given. The urine culture grew out multiple species. -Patient is improving clinically. Would treat for 3 days and then discontinue antibiotics.  Dilated loop of sigmoid colon with fecal impaction. Abdominal x-ray on admission revealed possible sigmoid volvulus. CT scan was ordered, but in the meantime, GI was consulted. Per Dr. Jena Gauss, if volvulus was confirmed, they would attempt endoscopic decompression. However, the CT scan did not show a volvulus. Therefore, the patient was treated for constipation with a soapsuds enema and then subsequently MiraLAX daily. -She has had one  bowel movement. -Continue current management.  Right kidney mass. CT revealed a 1.5 cm hyperdense mass exophytic in the right kidney. Radiologist felt that it could be a benign hemorrhagic cyst. -We'll order a renal ultrasound for further evaluation.  Chronic atrial fibrillation. The patient is treated chronically with aspirin and metoprolol for rate control. Currently stable, but heart rate borders on bradycardia.  Mildly elevated troponin I. Patient's troponin I has ranged from 0.04-0.16. She denies chest pain. No further workup is needed. No worrisome ST or T-wave abnormality seen on the EKG. -2-D echocardiogram ordered and revealed an EF of 55-60% and basal hypokinesis. Given her age, debilitation, and dementia, would favor treating the patient conservatively. She is already on a beta blocker. -We'll consider adding an aspirin, pending follow-up of her platelet count.  Chronic Hypertension. Patient is treated chronically with metoprolol and lisinopril. Lisinopril is on hold. Blood pressure is currently stable.  Normocytic anemia, of unknown cause. We'll order a few anemia studies. We'll continue her PPI.  Thrombocytopenia. Patient's platelet count was 182 on admission. It has fallen to 109. There was noted to be some platelet clumping previously. -We'll hold Lovenox and ordered SCDs for DVT prophylaxis until further clarified. We'll order a vitamin B12 level and TSH for further evaluations.  Seizure disorder. Patient is treated with Eslicarbazepine Acetate and Keppra chronically. They were continued. Currently stable.  Hypokalemia and hypomagnesemia. The patient's serum potassium has been mildly low. Will start potassium chloride supplementation. Her magnesium level was also borderline low. We'll start milk of magnesia given her constipation.  Hypotension. There was one isolated low blood pressure in the ED which was likely a  mistake.  Lactic acidosis. Patient's lactic acid  levels greater than 4 on admission. This was likely secondary to dehydration rather than sepsis. Her lactate level has normalized.       DVT prophylaxis: Lovenox>> discontinued on 7/19>> SCDs Code Status: DO NOT RESUSCITATE Family Communication: Family not available Disposition Plan: Discharge back to [facility] when clinically appropriate, likely in the next 1-2 days.   Consultants:   Gastroenterology  Procedures:  2-D echocardiogram on 02/13/16: Impressions: - Mild LVH with LVEF 55-60%. Basal inferior hypokinesis. Grade 1  diastolic dysfunction with normal estimated LV filling pressure.  Upper normal left atrial chamber size. MAC with mild mitral  regurgitation. Sclerotic aortic valve without obvious stenosis,   trivial aortic regurgitation. Trivial tricuspid regurgitation.  Antimicrobials:   Rocephin    Subjective: Patient denies abdominal pain, nausea, or vomiting.  Objective: Filed Vitals:   02/13/16 2134 02/14/16 0427 02/14/16 0625 02/14/16 0800  BP: 171/74  131/84   Pulse: 63  58 56  Temp: 99.6 F (37.6 C)  98.4 F (36.9 C)   TempSrc: Oral  Oral   Resp: 18  20   Height:      Weight:  64.32 kg (141 lb 12.8 oz)    SpO2: 96%  97%     Intake/Output Summary (Last 24 hours) at 02/14/16 1302 Last data filed at 02/14/16 1224  Gross per 24 hour  Intake 1758.75 ml  Output      0 ml  Net 1758.75 ml   Filed Weights   02/12/16 1338 02/13/16 0500 02/14/16 0427  Weight: 61.236 kg (135 lb) 63.3 kg (139 lb 8.8 oz) 64.32 kg (141 lb 12.8 oz)    Examination:  General exam: Appears calm and comfortable; Pleasant elderly 82 year old woman in no acute distress.  Respiratory system: Decreased breath sounds in the bases and clear anteriorly. Respiratory effort normal. Cardiovascular system: Irregular, irregular; borderline bradycardia  No pedal edema. Gastrointestinal system: Abdomen is nondistended, soft and nontender. No organomegaly or masses felt. Normal bowel  sounds heard. Central nervous system: Alert and oriented to herself in the hospital. No focal neurological deficits. Extremities: No acute hot red joints. Skin: No rashes, lesions or ulcers Psychiatry: Judgement and insight appear normal. Mood & affect appropriate.     Data Reviewed: I have personally reviewed following labs and imaging studies  CBC:  Recent Labs Lab 02/12/16 1441 02/12/16 1502 02/13/16 0814 02/14/16 0610  WBC 10.5  --  6.8 6.3  NEUTROABS 8.1*  --   --   --   HGB 12.2 12.9 10.3* 9.8*  HCT 36.8 38.0 30.5* 29.5*  MCV 90.6  --  89.7 89.7  PLT PLATELET CLUMPS NOTED ON SMEAR  --  182 109*   Basic Metabolic Panel:  Recent Labs Lab 02/12/16 1441 02/12/16 1502 02/13/16 0814 02/13/16 1249 02/14/16 0610  NA 138 141 139  --  139  K 3.2* 3.3* 3.0*  --  3.4*  CL 104 101 108  --  108  CO2 25  --  27  --  24  GLUCOSE 141* 138* 86  --  84  BUN 12 11 15   --  15  CREATININE 1.04* 1.00 0.80  --  0.58  CALCIUM 8.5*  --  7.6*  --  8.2*  MG  --   --   --  1.4*  --    GFR: Estimated Creatinine Clearance: 45.5 mL/min (by C-G formula based on Cr of 0.58). Liver Function Tests:  Recent Labs Lab 02/12/16  1441 02/13/16 0814  AST 31 19  ALT 15 11*  ALKPHOS 95 72  BILITOT 0.9 0.6  PROT 6.4* 5.0*  ALBUMIN 3.1* 2.3*   No results for input(s): LIPASE, AMYLASE in the last 168 hours. No results for input(s): AMMONIA in the last 168 hours. Coagulation Profile:  Recent Labs Lab 02/12/16 1441  INR 1.25   Cardiac Enzymes:  Recent Labs Lab 02/12/16 1441 02/13/16 0050 02/13/16 0814 02/13/16 1249  TROPONINI 0.04* 0.16* 0.12* 0.11*   BNP (last 3 results) No results for input(s): PROBNP in the last 8760 hours. HbA1C: No results for input(s): HGBA1C in the last 72 hours. CBG: No results for input(s): GLUCAP in the last 168 hours. Lipid Profile: No results for input(s): CHOL, HDL, LDLCALC, TRIG, CHOLHDL, LDLDIRECT in the last 72 hours. Thyroid Function  Tests: No results for input(s): TSH, T4TOTAL, FREET4, T3FREE, THYROIDAB in the last 72 hours. Anemia Panel: No results for input(s): VITAMINB12, FOLATE, FERRITIN, TIBC, IRON, RETICCTPCT in the last 72 hours. Sepsis Labs:  Recent Labs Lab 02/12/16 1502 02/12/16 1824  LATICACIDVEN 4.24* 1.74    Recent Results (from the past 240 hour(s))  Blood culture (routine x 2)     Status: None (Preliminary result)   Collection Time: 02/12/16  2:41 PM  Result Value Ref Range Status   Specimen Description BLOOD RIGHT ARM  Final   Special Requests BOTTLES DRAWN AEROBIC AND ANAEROBIC 4CC EACH  Final   Culture NO GROWTH 2 DAYS  Final   Report Status PENDING  Incomplete  Blood culture (routine x 2)     Status: None (Preliminary result)   Collection Time: 02/12/16  2:56 PM  Result Value Ref Range Status   Specimen Description BLOOD LEFT ARM  Final   Special Requests BOTTLES DRAWN AEROBIC ONLY 4CC  Final   Culture NO GROWTH 2 DAYS  Final   Report Status PENDING  Incomplete  Urine culture     Status: Abnormal   Collection Time: 02/12/16  6:04 PM  Result Value Ref Range Status   Specimen Description URINE, CATHETERIZED  Final   Special Requests NONE  Final   Culture MULTIPLE SPECIES PRESENT, SUGGEST RECOLLECTION (A)  Final   Report Status 02/14/2016 FINAL  Final  MRSA PCR Screening     Status: None   Collection Time: 02/12/16  6:37 PM  Result Value Ref Range Status   MRSA by PCR NEGATIVE NEGATIVE Final    Comment:        The GeneXpert MRSA Assay (FDA approved for NASAL specimens only), is one component of a comprehensive MRSA colonization surveillance program. It is not intended to diagnose MRSA infection nor to guide or monitor treatment for MRSA infections.          Radiology Studies: Ct Abdomen Pelvis Wo Contrast  02/12/2016  CLINICAL DATA:  Altered mental status, dilated colon on x-ray. Dementia. EXAM: CT ABDOMEN AND PELVIS WITHOUT CONTRAST TECHNIQUE: Multidetector CT imaging of  the abdomen and pelvis was performed following the standard protocol without IV contrast. COMPARISON:  Abdomen plain film from earlier same day. FINDINGS: Lower chest: Small consolidations at each lung base, most likely atelectasis. Hepatobiliary: 1.1 and 1 cm hypodense lesions within the upper right liver lobe, too small to definitively characterize but most likely benign cysts. Liver otherwise unremarkable. Gallbladder appears normal. No bile duct dilatation seen. Pancreas: No mass or inflammatory process identified on this un-enhanced exam. Spleen: Within normal limits in size. Adrenals/Urinary Tract: Adrenal glands appear normal. Hyperdense mass  exophytic to the posterior cortex of the right kidney measures 1.5 cm, compatible with hemorrhagic cyst or solid mass. No right-sided renal stone or hydronephrosis. Left kidney appears normal without mass, stone or hydronephrosis Stomach/Bowel: Prominently distended gas-filled sigmoid colon. More proximal colon is also mildly distended with air and stool. Moderate amount of stool within the rectal vault. No evidence of acute obstruction at the level of the sigmoid colon. No sigmoid volvulus. Small bowel is normal in caliber throughout. No evidence of bowel wall thickening or bowel wall inflammation. Appendix is not seen but there are no inflammatory changes about the cecum to suggest acute appendicitis. Vascular/Lymphatic: Atherosclerotic changes are seen throughout the abdominal aorta. No enlarged lymph nodes seen within the abdomen or pelvis. Reproductive: No mass or other significant abnormality. Other: No free fluid or abscess collection seen. Mild fluid stranding within the presacral space is of uncertain significance, perhaps reactive given the adjacent sigmoid colon distension. No free intraperitoneal air. Musculoskeletal: Degenerative changes are seen throughout the scoliotic thoracolumbar spine, moderate in degree. No acute or suspicious osseous finding.  Superficial soft tissues are unremarkable. IMPRESSION: 1. No sigmoid volvulus. No evidence of acute large bowel or small bowel obstruction. 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 There is a moderate amount of stool and fluid within the rectal vault (partial impaction? ). 2. Hyperdense mass exophytic to the posterior cortex of the right kidney, measuring 1.5 cm, difficult to characterize without intravascular contrast. Solid mass cannot be excluded but favor benign hemorrhagic cyst. 3. Probable liver cysts, as described above. 4. Aortic atherosclerosis. Electronically Signed   By: Bary Richard M.D.   On: 02/12/2016 18:19   Ct Head Wo Contrast  02/12/2016  CLINICAL DATA:  Altered mental status. History of seizures, stroke, dementia. EXAM: CT HEAD WITHOUT CONTRAST TECHNIQUE: Contiguous axial images were obtained from the base of the skull through the vertex without intravenous contrast. COMPARISON:  Head CT dated 06/26/2015 and brain MRI dated 06/27/2015. FINDINGS: Brain: Again noted is generalized age related parenchymal atrophy with commensurate dilatation of the ventricles and sulci. Also again noted are chronic small vessel ischemic changes within the bilateral periventricular and subcortical white matter regions. There is no mass, hemorrhage, edema or other evidence of acute parenchymal abnormality. No extra-axial hemorrhage. Vascular: No hyperdense vessel or unexpected calcification. There are chronic calcified atherosclerotic changes of the large vessels at the skull base. Skull: Negative for fracture or focal lesion. Sinuses/Orbits: No acute findings. Other: None. IMPRESSION: 1. No acute intracranial findings. No intracranial mass, hemorrhage or edema. 2. Atrophy and chronic ischemic changes in the white matter. Electronically Signed   By: Bary Richard M.D.   On:  02/12/2016 18:02   Dg Chest Portable 1 View  02/12/2016  CLINICAL DATA:  82 year old female with history of altered mental status today. EXAM: PORTABLE CHEST 1 VIEW COMPARISON:  Chest x-ray 06/18/2015. FINDINGS: Skin fold projecting over the mid to lower left hemithorax. Lung volumes are low. No consolidative airspace disease. No pleural effusions. No evidence of pulmonary edema. Heart size is normal. The patient is rotated to the right on today's exam, resulting in distortion of the mediastinal contours and reduced diagnostic sensitivity and specificity for mediastinal pathology. Atherosclerosis in the thoracic aorta. IMPRESSION: 1. Low lung volumes without radiographic evidence of acute cardiopulmonary disease. 2. Aortic atherosclerosis. Electronically Signed   By: Brayton Mars.D.  On: 02/12/2016 14:19   Dg Abd Portable 1v  02/12/2016  CLINICAL DATA:  Altered mental status EXAM: PORTABLE ABDOMEN - 1 VIEW COMPARISON:  None. FINDINGS: Dilated loop of sigmoid colon. While nonspecific, this appearance does raise concern for sigmoid volvulus. IMPRESSION: Dilated loop of sigmoid colon, nonspecific but raising concern for sigmoid volvulus. Consider CT abdomen pelvis for further evaluation as clinically warranted. Electronically Signed   By: Charline Bills M.D.   On: 02/12/2016 14:22        Scheduled Meds: . cefTRIAXone (ROCEPHIN)  IV  1 g Intravenous Q24H  . enoxaparin (LOVENOX) injection  40 mg Subcutaneous Q24H  . Eslicarbazepine Acetate  1 tablet Oral Daily  . feeding supplement (PRO-STAT SUGAR FREE 64)  30 mL Oral BID  . lactose free nutrition  237 mL Oral TID WC  . levETIRAcetam  500 mg Oral BID  . metoprolol tartrate  25 mg Oral Daily  . pantoprazole  40 mg Oral QAC breakfast  . polyethylene glycol  17 g Oral Daily  . potassium chloride SA  20 mEq Oral TID  . sodium chloride flush  3 mL Intravenous Q12H   Continuous Infusions: . sodium chloride 75 mL/hr at 02/14/16 1153      LOS: 2 days    Time spent: 35 minutes    Elliot Cousin, MD [facility] Pager [phone]  If 7PM-7AM, please contact night-coverage [email or web address] Password [email or web address] 02/14/2016, 1:02 PM

## 2016-02-14 NOTE — Care Management Important Message (Signed)
Important Message  Patient Details  Name: Joan Mathis MRN: [id number] Date of Birth: 1926-05-02   Medicare Important Message Given:  Yes    Joan Mathis, Joan Petitti L, RN 02/14/2016, 8:53 AM

## 2016-02-14 NOTE — NC FL2 (Signed)
[place] MEDICAID FL2 LEVEL OF CARE SCREENING TOOL     IDENTIFICATION  Patient Name: Joan Mathis Birthdate: May 20, 1926 Sex: female Admission Date (Current Location): 02/12/2016  [facility] [place]   Facility and Address:  [facility],  [address], [place] [id number]      Provider Number: [id number]  Attending Physician Name and Address:  Elliot Cousinenise Fisher, MD  Relative Name and Phone Number:       Current Level of Care: Hospital Recommended Level of Care: Skilled Nursing Facility Prior Approval Number:    Date Approved/Denied:   PASRR Number: [id number]  Discharge Plan: SNF    Current Diagnoses: Patient Active Problem List   Diagnosis Date Noted  . Dehydration   . Sigmoid volvulus (HCC) 02/12/2016  . Hypotension 02/12/2016  . Abdominal pain 02/12/2016  . Altered mental status 02/12/2016  . Hyperlipidemia 04/03/2015  . Dementia without behavioral disturbance 04/03/2015  . Dementia with behavioral disturbance   . Atrial fibrillation with RVR (HCC)   . TIA (transient ischemic attack) 04/02/2015  . HYPERLIPIDEMIA-MIXED 05/10/2010  . Essential hypertension, benign 05/10/2010  . CAD, NATIVE VESSEL 05/10/2010    Orientation RESPIRATION BLADDER Height & Weight        Normal Incontinent Weight: 141 lb 12.8 oz (64.32 kg) Height:  5\' 6"  (167.6 cm)  BEHAVIORAL SYMPTOMS/MOOD NEUROLOGICAL BOWEL NUTRITION STATUS      Continent Diet, NG/panda (Diet DYS 2)  AMBULATORY STATUS COMMUNICATION OF NEEDS Skin   Extensive Assist Verbally Normal                       Personal Care Assistance Level of Assistance  Bathing, Feeding, Dressing Bathing Assistance: Maximum assistance Feeding assistance: Limited assistance Dressing Assistance: Maximum assistance     Functional Limitations Info  Sight, Hearing, Speech Sight Info: Adequate Hearing Info: Adequate Speech Info: Adequate    SPECIAL CARE FACTORS FREQUENCY                       Contractures      Additional Factors Info  Code Status Code Status Info: DNR             Current Medications (02/14/2016):  This is the current hospital active medication list Current Facility-Administered Medications  Medication Dose Route Frequency Provider Last Rate Last Dose  . 0.9 %  sodium chloride infusion   Intravenous Continuous Henderson CloudEstela Y Hernandez Acosta, MD 75 mL/hr at 02/14/16 0800    . acetaminophen (TYLENOL) tablet 650 mg  650 mg Oral Q6H PRN Pearson GrippeJames Kim, MD       Or  . acetaminophen (TYLENOL) suppository 650 mg  650 mg Rectal Q6H PRN Pearson GrippeJames Kim, MD      . cefTRIAXone (ROCEPHIN) 1 g in dextrose 5 % 50 mL IVPB  1 g Intravenous Q24H Pearson GrippeJames Kim, MD   1 g at 02/14/16 0109  . enoxaparin (LOVENOX) injection 40 mg  40 mg Subcutaneous Q24H Pearson GrippeJames Kim, MD   40 mg at 02/14/16 0104  . Eslicarbazepine Acetate TABS 1 tablet  1 tablet Oral Daily Pearson GrippeJames Kim, MD   Stopped at 02/14/16 1013  . feeding supplement (PRO-STAT SUGAR FREE 64) liquid 30 mL  30 mL Oral BID Pearson GrippeJames Kim, MD   30 mL at 02/14/16 0829  . lactose free nutrition (BOOST PLUS) liquid 237 mL  237 mL Oral TID WC West BaliSandi L Fields, MD   237 mL at 02/14/16 0830  . levETIRAcetam (  KEPPRA) tablet 500 mg  500 mg Oral BID Pearson Grippe, MD   500 mg at 02/14/16 0829  . metoprolol tartrate (LOPRESSOR) tablet 25 mg  25 mg Oral Daily Pearson Grippe, MD   Stopped at 02/14/16 [phone]  . pantoprazole (PROTONIX) EC tablet 40 mg  40 mg Oral QAC breakfast West Bali, MD   40 mg at 02/14/16 0829  . polyethylene glycol (MIRALAX / GLYCOLAX) packet 17 g  17 g Oral Daily Tiffany Kocher, PA-C   17 g at 02/14/16 1013  . potassium chloride SA (K-DUR,KLOR-CON) CR tablet 20 mEq  20 mEq Oral TID Pearson Grippe, MD   20 mEq at 02/14/16 0829  . sodium chloride flush (NS) 0.9 % injection 3 mL  3 mL Intravenous Q12H Pearson Grippe, MD   3 mL at 02/14/16 0830     Discharge Medications: Please see discharge summary for a list of discharge medications.  Relevant Imaging  Results:  Relevant Lab Results:   Additional Information    Joan Mathis, Joan China, Joan Mathis

## 2016-02-15 ENCOUNTER — Inpatient Hospital Stay (HOSPITAL_COMMUNITY): Payer: Medicare Other

## 2016-02-15 ENCOUNTER — Encounter (HOSPITAL_COMMUNITY): Payer: Self-pay | Admitting: Internal Medicine

## 2016-02-15 LAB — CBC
HEMATOCRIT: 33 % — AB (ref 36.0–46.0)
HEMOGLOBIN: 11 g/dL — AB (ref 12.0–15.0)
MCH: 29.7 pg (ref 26.0–34.0)
MCHC: 33.3 g/dL (ref 30.0–36.0)
MCV: 89.2 fL (ref 78.0–100.0)
Platelets: 158 10*3/uL (ref 150–400)
RBC: 3.7 MIL/uL — AB (ref 3.87–5.11)
RDW: 14.7 % (ref 11.5–15.5)
WBC: 6.8 10*3/uL (ref 4.0–10.5)

## 2016-02-15 LAB — IRON AND TIBC
Iron: 51 ug/dL (ref 28–170)
Saturation Ratios: 30 % (ref 10.4–31.8)
TIBC: 168 ug/dL — ABNORMAL LOW (ref 250–450)
UIBC: 117 ug/dL

## 2016-02-15 LAB — BASIC METABOLIC PANEL
ANION GAP: 7 (ref 5–15)
BUN: 10 mg/dL (ref 6–20)
CHLORIDE: 107 mmol/L (ref 101–111)
CO2: 25 mmol/L (ref 22–32)
Calcium: 8.4 mg/dL — ABNORMAL LOW (ref 8.9–10.3)
Creatinine, Ser: 0.53 mg/dL (ref 0.44–1.00)
GFR calc Af Amer: >60 mL/min (ref >60)
Glucose, Bld: 86 mg/dL (ref 65–99)
POTASSIUM: 3.5 mmol/L (ref 3.5–5.1)
SODIUM: 139 mmol/L (ref 135–145)

## 2016-02-15 LAB — MAGNESIUM: MAGNESIUM: 1.4 mg/dL — AB (ref 1.7–2.4)

## 2016-02-15 LAB — VITAMIN B12: Vitamin B-12: 1390 pg/mL — ABNORMAL HIGH (ref 180–914)

## 2016-02-15 MED ORDER — LISINOPRIL 10 MG PO TABS
20.0000 mg | ORAL_TABLET | Freq: Every day | ORAL | Status: DC
  Administered 2016-02-15: 20 mg via ORAL
  Filled 2016-02-15: qty 2

## 2016-02-15 MED ORDER — POLYETHYLENE GLYCOL 3350 17 G PO PACK: 17.0000 g | PACK | Freq: Two times a day (BID) | ORAL | Status: DC

## 2016-02-15 MED ORDER — ASPIRIN 81 MG PO TBEC: 81.0000 mg | DELAYED_RELEASE_TABLET | Freq: Every day | ORAL | Status: DC

## 2016-02-15 MED ORDER — CEFTRIAXONE SODIUM 1 G IJ SOLR
INTRAMUSCULAR | Status: AC
  Filled 2016-02-15: qty 10

## 2016-02-15 MED ORDER — PRO-STAT SUGAR FREE PO LIQD: 30.0000 mL | Freq: Two times a day (BID) | ORAL | Status: DC

## 2016-02-15 MED ORDER — METOPROLOL SUCCINATE ER 50 MG PO TB24: 50.0000 mg | ORAL_TABLET | Freq: Every day | ORAL | Status: DC

## 2016-02-15 MED ORDER — MAGNESIUM OXIDE -MG SUPPLEMENT 200 MG PO TABS: 200.0000 mg | ORAL_TABLET | Freq: Every day | ORAL | Status: DC

## 2016-02-15 NOTE — Progress Notes (Signed)
Pt discharged back to [facility] today per Dr. Sherrie MustacheFisher.  Pt's IV sites d/c'd and WDL.  Pt's VSS.  Report called to nurse at [facility].  Verbalized understanding.  Pt left floor via EMS stretcher in stable condition accompanied by EMS transporters.

## 2016-02-15 NOTE — Discharge Summary (Signed)
Physician Discharge Summary  Joan Mathis LKG:[id number] DOB: [birth year] DOA: 02/12/2016  PCP: Bernerd Limbo, MD  Admit date: 02/12/2016 Discharge date: 02/15/2016  Time spent: Greater than 30 minutes  Recommendations for Outpatient Follow-up:  1. Total iron and vitamin B12 levels pending at the time of discharge.   Discharge Diagnoses:  Principal Problem:   Urinary tract infection, site not specified Active Problems:   Fecal impaction (HCC)   Hypotension   Dehydration   Essential hypertension, benign   Abdominal pain   Altered mental status   Hypokalemia   Anemia due to other cause   Chronic atrial fibrillation (HCC)   Thrombocytopenia (HCC)   Seizure disorder (HCC)   Right kidney mass  1. Urinary tract infection. 2. Fecal impaction. 3. Dehydration. 4. Essential hypertension. 5. Chronic atrial fibrillation. 6. Right kidney mass, consistent with a renal cyst. 7. Mild hypokalemia and hypomagnesemia. 8. Isolated thrombocytopenia; possible lab error. Platelet count within normal limits at discharge. 9. Seizure disorder. 10. Normocytic anemia of unknown cause, possibly due to chronic disease. 11. Probable dementia. 12. Acute encephalopathy-reported, likely secondary to #1 and #2. Resolved.  Discharge Condition: Improved  Diet recommendation: Heart healthy  Filed Weights   02/13/16 0500 02/14/16 0427 02/15/16 0654  Weight: 63.3 kg (139 lb 8.8 oz) 64.32 kg (141 lb 12.8 oz) 63.89 kg (140 lb 13.6 oz)    History of present illness:  Patient is an 82 year old woman with a history of dementia, strokes, chronic atrial fibrillation-not on anticoagulation, hypertension, and seizure disorder, who presented to the ED from [facility] on 02/12/16 with a report of fatigue and altered mental status. In the ED, she was mildly hypothermic and hemodynamically stable, though one isolated blood pressure showed a systolic blood pressure of 58. Her lactic acid was 4.2. Her urinalysis  was consistent with UTI. Her troponin I was 0.04. Her potassium was 3.3. CT of her head revealed no acute findings. Abdominal x-ray revealed dilated loop of sigmoid colon raising concern for sigmoid volvulus. She was admitted for further evaluation and management.  Hospital Course:   1. Urinary tract infection.  Blood cultures and urine culture were ordered in the ED. She was started on ceftriaxone. Gentle IV fluids were given. The urine culture grew out multiple species. The patient received 3 doses of IV Rocephin before it was discontinued at the time of discharge.  Dilated loop of sigmoid colon with fecal impaction. Abdominal x-ray on admission revealed possible sigmoid volvulus. CT scan was ordered, but in the meantime, GI was consulted. Per Dr. Jena Gauss, if volvulus was confirmed, they would attempt endoscopic decompression. However, the CT scan did not show a volvulus. Therefore, the patient was treated for constipation with a soapsuds enema and then subsequently MiraLAX daily. She had several bowel movements. She was discharged on twice a day dosing of MiraLAX.  Right kidney mass. CT revealed a 1.5 cm hyperdense mass exophytic in the right kidney. Radiologist felt that it could be a benign hemorrhagic cyst. Renal ultrasound was ordered for evaluation. It revealed a right mid to upper lobe lesion consistent with a renal cyst; no hydronephrosis; and bilateral renal parenchymal thickening.  Chronic atrial fibrillation. The patient is treated chronically with aspirin and metoprolol for rate control. They were both continued. Her heart rate was occasionally bradycardic, but otherwise mostly stable.  Mildly elevated troponin I. Patient's troponin I has ranged from 0.04-0.16. She denied chest pain.  No worrisome ST or T-wave abnormality seen on the EKG. -2-D echocardiogram ordered and revealed  an EF of 55-60% and basal hypokinesis. Given her age, debilitation, and dementia, would favor treating the  patient conservatively. She is already on a beta blocker and aspirin which were continued.  Chronic Hypertension. Patient is treated chronically with metoprolol and lisinopril. Lisinopril was apparently held on admission. The patient's blood pressure trended up, so lisinopril was restarted.  Normocytic anemia, of unknown cause. Patient's hemoglobin ranged from 9.8-11. Total iron and vitamin B12 levels were ordered, but the results were pending at the time of discharge. Her TSH was within normal limits. She was continued on her PPI.  Thrombocytopenia. Patient's platelet count was 182 on admission. It has fallen to 109. There was noted to be some platelet clumping previously. -We'll hold Lovenox and ordered SCDs for DVT prophylaxis until further clarified. We'll order a vitamin B12 level and TSH for further evaluations.  Seizure disorder. Patient is treated with Eslicarbazepine Acetate and Keppra chronically. They were continued. Currently stable.  Hypokalemia and hypomagnesemia. The patient's serum potassium has been mildly low. Potassium chloride supplementation was restarted. Will start potassium chloride supplementation. Her magnesium level was also borderline low. She was given milk of magnesia for constipation. She was subsequently started on magnesium oxide for treatment.  Hypotension. There was one isolated low blood pressure in the ED which was likely a mistake.  Lactic acidosis. Patient's lactic acid level was greater than 4 on admission. This was likely secondary to dehydration rather than sepsis. Her lactate level  Normalized.  Acute encephalopathy. It was reported that the patient was confused and fatigued on admission. Patient appears to have mild underlying dementia. She did not appear encephalopathic during the hospitalization. If she was on admission, it was likely secondary to the UTI or fecal impaction.   Procedures: 2-D echocardiogram on 02/13/16: Impressions: - Mild  LVH with LVEF 55-60%. Basal inferior hypokinesis. Grade 1  diastolic dysfunction with normal estimated LV filling pressure.  Upper normal left atrial chamber size. MAC with mild mitral  regurgitation. Sclerotic aortic valve without obvious stenosis,   trivial aortic regurgitation. Trivial tricuspid regurgitation  Consultations:  Gastroenterology  Discharge Exam: Filed Vitals:   02/15/16 0654 02/15/16 1041  BP: 138/64 174/98  Pulse: 64 79  Temp: 98.2 F (36.8 C)   Resp: 20     General: Pleasant 82 year old woman in no acute distress. Cardiovascular: Irregular, irregular. Respiratory: Clear to auscultation bilaterally. Abdomen: Positive bowel sounds, soft, nontender, nondistended.  Discharge Instructions   Discharge Instructions    Diet - low sodium heart healthy    Complete by:  As directed      Increase activity slowly    Complete by:  As directed           Current Discharge Medication List    START taking these medications   Details  Amino Acids-Protein Hydrolys (FEEDING SUPPLEMENT, PRO-STAT SUGAR FREE 64,) LIQD Take 30 mLs by mouth 2 (two) times daily. Qty: 900 mL, Refills: 0    Magnesium Oxide 200 MG TABS Take 1 tablet (200 mg total) by mouth daily.    polyethylene glycol (MIRALAX / GLYCOLAX) packet Take 17 g by mouth 2 (two) times daily. Qty: 14 each, Refills: 0      CONTINUE these medications which have CHANGED   Details  aspirin 81 MG EC tablet Take 1 tablet (81 mg total) by mouth at bedtime. Swallow whole. Qty: 30 tablet, Refills: 0      CONTINUE these medications which have NOT CHANGED   Details  acetaminophen (TYLENOL) 325 MG  tablet Take 650 mg by mouth every 6 (six) hours as needed.    atorvastatin (LIPITOR) 10 MG tablet Take 1 tablet (10 mg total) by mouth daily at 6 PM. Qty: 30 tablet, Refills: 0    cyanocobalamin 2000 MCG tablet Take 2,000 mcg by mouth daily.    Eslicarbazepine Acetate 400 MG TABS Take 1 tablet by mouth daily.     levETIRAcetam (KEPPRA) 500 MG tablet Take 500 mg by mouth 2 (two) times daily.    lisinopril (PRINIVIL,ZESTRIL) 20 MG tablet Take 20 mg by mouth daily.    metoprolol tartrate (LOPRESSOR) 25 MG tablet Take 25 mg by mouth daily.     omeprazole (PRILOSEC) 20 MG capsule Take 20 mg by mouth daily.    potassium chloride SA (K-DUR,KLOR-CON) 20 MEQ tablet Take 20 mEq by mouth 3 (three) times daily.      STOP taking these medications     ibuprofen (ADVIL,MOTRIN) 400 MG tablet        No Known Allergies    The results of significant diagnostics from this hospitalization (including imaging, microbiology, ancillary and laboratory) are listed below for reference.    Significant Diagnostic Studies: Ct Abdomen Pelvis Wo Contrast  02/12/2016  CLINICAL DATA:  Altered mental status, dilated colon on x-ray. Dementia. EXAM: CT ABDOMEN AND PELVIS WITHOUT CONTRAST TECHNIQUE: Multidetector CT imaging of the abdomen and pelvis was performed following the standard protocol without IV contrast. COMPARISON:  Abdomen plain film from earlier same day. FINDINGS: Lower chest: Small consolidations at each lung base, most likely atelectasis. Hepatobiliary: 1.1 and 1 cm hypodense lesions within the upper right liver lobe, too small to definitively characterize but most likely benign cysts. Liver otherwise unremarkable. Gallbladder appears normal. No bile duct dilatation seen. Pancreas: No mass or inflammatory process identified on this un-enhanced exam. Spleen: Within normal limits in size. Adrenals/Urinary Tract: Adrenal glands appear normal. Hyperdense mass exophytic to the posterior cortex of the right kidney measures 1.5 cm, compatible with hemorrhagic cyst or solid mass. No right-sided renal stone or hydronephrosis. Left kidney appears normal without mass, stone or hydronephrosis Stomach/Bowel: Prominently distended gas-filled sigmoid colon. More proximal colon is also mildly distended with air and stool. Moderate  amount of stool within the rectal vault. No evidence of acute obstruction at the level of the sigmoid colon. No sigmoid volvulus. Small bowel is normal in caliber throughout. No evidence of bowel wall thickening or bowel wall inflammation. Appendix is not seen but there are no inflammatory changes about the cecum to suggest acute appendicitis. Vascular/Lymphatic: Atherosclerotic changes are seen throughout the abdominal aorta. No enlarged lymph nodes seen within the abdomen or pelvis. Reproductive: No mass or other significant abnormality. Other: No free fluid or abscess collection seen. Mild fluid stranding within the presacral space is of uncertain significance, perhaps reactive given the adjacent sigmoid colon distension. No free intraperitoneal air. Musculoskeletal: Degenerative changes are seen throughout the scoliotic thoracolumbar spine, moderate in degree. No acute or suspicious osseous finding. Superficial soft tissues are unremarkable. IMPRESSION: 1. No sigmoid volvulus. No evidence of acute large bowel or small bowel obstruction. 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 There is a moderate amount of stool and fluid within the rectal vault (partial impaction? ). 2. Hyperdense mass exophytic to the posterior cortex of the right kidney, measuring 1.5 cm, difficult to characterize without intravascular contrast. Solid mass cannot be excluded but  favor benign hemorrhagic cyst. 3. Probable liver cysts, as described above. 4. Aortic atherosclerosis. Electronically Signed   By: Bary Richard M.D.   On: 02/12/2016 18:19   Ct Head Wo Contrast  02/12/2016  CLINICAL DATA:  Altered mental status. History of seizures, stroke, dementia. EXAM: CT HEAD WITHOUT CONTRAST TECHNIQUE: Contiguous axial images were obtained from the base of the skull through the vertex without  intravenous contrast. COMPARISON:  Head CT dated 06/26/2015 and brain MRI dated 06/27/2015. FINDINGS: Brain: Again noted is generalized age related parenchymal atrophy with commensurate dilatation of the ventricles and sulci. Also again noted are chronic small vessel ischemic changes within the bilateral periventricular and subcortical white matter regions. There is no mass, hemorrhage, edema or other evidence of acute parenchymal abnormality. No extra-axial hemorrhage. Vascular: No hyperdense vessel or unexpected calcification. There are chronic calcified atherosclerotic changes of the large vessels at the skull base. Skull: Negative for fracture or focal lesion. Sinuses/Orbits: No acute findings. Other: None. IMPRESSION: 1. No acute intracranial findings. No intracranial mass, hemorrhage or edema. 2. Atrophy and chronic ischemic changes in the white matter. Electronically Signed   By: Bary Richard M.D.   On: 02/12/2016 18:02   US Renal  02/15/2016  CLINICAL DATA:  Possible right renal mass for recent CT. History of hypertension, dementia. EXAM: RENAL / URINARY TRACT ULTRASOUND COMPLETE COMPARISON:  CT 02/12/2016 FINDINGS: Right Kidney: Length: 11.1 cm. Renal parenchymal thinning. Within the mid upper pole region there is near anechoic lesion measuring 1.5 x 1.1 x 1.3 cm. There is associated increased through transmission. No solid lesion or hydronephrosis. Left Kidney: Length: 10.3 cm in length. Renal parenchymal thinning is present. No hydronephrosis. No focal renal mass. Bladder: Appears normal for degree of bladder distention. Additional: Study quality is degraded by patient's level of cooperation secondary to dementia. IMPRESSION: 1. Bilateral renal parenchymal thinning. 2. No hydronephrosis. 3. Right mid to upper pole lesion consistent with renal cyst. Electronically Signed   By: Norva Pavlov M.D.   On: 02/15/2016 10:19   Dg Chest Portable 1 View  02/12/2016  CLINICAL DATA:  82 year old female with  history of altered mental status today. EXAM: PORTABLE CHEST 1 VIEW COMPARISON:  Chest x-ray 06/18/2015. FINDINGS: Skin fold projecting over the mid to lower left hemithorax. Lung volumes are low. No consolidative airspace disease. No pleural effusions. No evidence of pulmonary edema. Heart size is normal. The patient is rotated to the right on today's exam, resulting in distortion of the mediastinal contours and reduced diagnostic sensitivity and specificity for mediastinal pathology. Atherosclerosis in the thoracic aorta. IMPRESSION: 1. Low lung volumes without radiographic evidence of acute cardiopulmonary disease. 2. Aortic atherosclerosis. Electronically Signed   By: Trudie Reed M.D.   On: 02/12/2016 14:19   Dg Abd Portable 1v  02/12/2016  CLINICAL DATA:  Altered mental status EXAM: PORTABLE ABDOMEN - 1 VIEW COMPARISON:  None. FINDINGS: Dilated loop of sigmoid colon. While nonspecific, this appearance does raise concern for sigmoid volvulus. IMPRESSION: Dilated loop of sigmoid colon, nonspecific but raising concern for sigmoid volvulus. Consider CT abdomen pelvis for further evaluation as clinically warranted. Electronically Signed   By: Charline Bills M.D.   On: 02/12/2016 14:22    Microbiology: Recent Results (from the past 240 hour(s))  Blood culture (routine x 2)     Status: None (Preliminary result)   Collection Time: 02/12/16  2:41 PM  Result Value Ref Range Status   Specimen Description BLOOD RIGHT ARM  Final   Special  Requests BOTTLES DRAWN AEROBIC AND ANAEROBIC 4CC EACH  Final   Culture NO GROWTH 3 DAYS  Final   Report Status PENDING  Incomplete  Blood culture (routine x 2)     Status: None (Preliminary result)   Collection Time: 02/12/16  2:56 PM  Result Value Ref Range Status   Specimen Description BLOOD LEFT ARM  Final   Special Requests BOTTLES DRAWN AEROBIC ONLY 4CC  Final   Culture NO GROWTH 3 DAYS  Final   Report Status PENDING  Incomplete  Urine culture     Status:  Abnormal   Collection Time: 02/12/16  6:04 PM  Result Value Ref Range Status   Specimen Description URINE, CATHETERIZED  Final   Special Requests NONE  Final   Culture MULTIPLE SPECIES PRESENT, SUGGEST RECOLLECTION (A)  Final   Report Status 02/14/2016 FINAL  Final  MRSA PCR Screening     Status: None   Collection Time: 02/12/16  6:37 PM  Result Value Ref Range Status   MRSA by PCR NEGATIVE NEGATIVE Final    Comment:        The GeneXpert MRSA Assay (FDA approved for NASAL specimens only), is one component of a comprehensive MRSA colonization surveillance program. It is not intended to diagnose MRSA infection nor to guide or monitor treatment for MRSA infections.      Labs: Basic Metabolic Panel:  Recent Labs Lab 02/12/16 1441 02/12/16 1502 02/13/16 0814 02/13/16 1249 02/14/16 0610 02/15/16 0543  NA 138 141 139  --  139 139  K 3.2* 3.3* 3.0*  --  3.4* 3.5  CL 104 101 108  --  108 107  CO2 25  --  27  --  24 25  GLUCOSE 141* 138* 86  --  84 86  BUN 12 11 15   --  15 10  CREATININE 1.04* 1.00 0.80  --  0.58 0.53  CALCIUM 8.5*  --  7.6*  --  8.2* 8.4*  MG  --   --   --  1.4*  --  1.4*   Liver Function Tests:  Recent Labs Lab 02/12/16 1441 02/13/16 0814  AST 31 19  ALT 15 11*  ALKPHOS 95 72  BILITOT 0.9 0.6  PROT 6.4* 5.0*  ALBUMIN 3.1* 2.3*   No results for input(s): LIPASE, AMYLASE in the last 168 hours. No results for input(s): AMMONIA in the last 168 hours. CBC:  Recent Labs Lab 02/12/16 1441 02/12/16 1502 02/13/16 0814 02/14/16 0610 02/15/16 0543  WBC 10.5  --  6.8 6.3 6.8  NEUTROABS 8.1*  --   --   --   --   HGB 12.2 12.9 10.3* 9.8* 11.0*  HCT 36.8 38.0 30.5* 29.5* 33.0*  MCV 90.6  --  89.7 89.7 89.2  PLT PLATELET CLUMPS NOTED ON SMEAR  --  182 109* 158   Cardiac Enzymes:  Recent Labs Lab 02/12/16 1441 02/13/16 0050 02/13/16 0814 02/13/16 1249  TROPONINI 0.04* 0.16* 0.12* 0.11*   BNP: BNP (last 3 results) No results for input(s):  BNP in the last 8760 hours.  ProBNP (last 3 results) No results for input(s): PROBNP in the last 8760 hours.  CBG: No results for input(s): GLUCAP in the last 168 hours.     Signed:  Aleya Durnell MD.  [facility] 02/15/2016, 11:52 AM

## 2016-02-15 NOTE — Clinical Social Work Note (Signed)
Clinical Social Work Assessment  Patient Details  Name: Joan Mathis MRN: [id number] Date of Birth: 22-Jun-1926  Date of referral:  02/15/16               Reason for consult:  Other (Comment Required) (From [facility])                Permission sought to share information with:    Permission granted to share information::     Name::        Agency::     Relationship::     Contact Information:     Housing/Transportation Living arrangements for the past 2 months:  Skilled Nursing Facility Source of Information:  Facility, Adult Children Patient Interpreter Needed:  None Criminal Activity/Legal Involvement Pertinent to Current Situation/Hospitalization:  No - Comment as needed Significant Relationships:  Adult Children Lives with:  Facility Resident Do you feel safe going back to the place where you live?  Yes Need for family participation in patient care:  Yes (Comment)  Care giving concerns:  None identified.   Social Worker assessment / plan: Patient from [facility].  [facility] since [facility]/28/15. Patient is total care but is receiving PT. Patient has family support and can return. Patient's son, Dannielle HuhDanny, is at bedside. He confirmed Debbie's statements. CSW advised that patient was being discharged.  CSW left VM message for Eunice BlaseDebbie advising that patient was being discharged.  CSW signing off.   Employment status:    Health and safety inspectornsurance information:  [place] Recommendations:  Not assessed at this time Information / Referral to community resources:     Patient/Family's Response to care:  Family is agreeable to go to SNF.   Patient/Family's Understanding of and Emotional Response to Diagnosis, Current Treatment, and Prognosis:  Patient family understands patient's diagnosis, treatment and prognosis.   Emotional Assessment Appearance:    Attitude/Demeanor/Rapport:  Aggressive (Verbally and/or physically) Affect (typically observed):   Calm Orientation:    Alcohol / Substance use:  Not Applicable Psych involvement (Current and /or in the community):  No (Comment)  Discharge Needs  Concerns to be addressed:  Other (Comment Required (return to [facility]) Readmission within the last 30 days:  No Current discharge risk:  None Barriers to Discharge:  Psych Bed not available, No Barriers Identified   Annice NeedySettle, Hanna Aultman D, LCSW 02/15/2016, 12:46 PM

## 2016-02-17 LAB — CULTURE, BLOOD (ROUTINE X 2)
CULTURE: NO GROWTH
CULTURE: NO GROWTH

## 2016-02-19 MED FILL — Eslicarbazepine Acetate Tab 400 MG: ORAL | Qty: 2 | Status: AC

## 2016-09-02 ENCOUNTER — Encounter (HOSPITAL_COMMUNITY): Payer: Self-pay | Admitting: *Deleted

## 2016-09-02 ENCOUNTER — Emergency Department (HOSPITAL_COMMUNITY): Payer: Medicare Other

## 2016-09-02 ENCOUNTER — Emergency Department (HOSPITAL_COMMUNITY)
Admission: EM | Admit: 2016-09-02 | Discharge: 2016-09-03 | Disposition: A | Discharge status: Home / Self Care | Payer: Medicare Other | Attending: Emergency Medicine | Admitting: Emergency Medicine

## 2016-09-02 DIAGNOSIS — Z79899 Other long term (current) drug therapy: Secondary | ICD-10-CM | POA: Diagnosis not present

## 2016-09-02 DIAGNOSIS — Y999 Unspecified external cause status: Secondary | ICD-10-CM | POA: Insufficient documentation

## 2016-09-02 DIAGNOSIS — W19XXXA Unspecified fall, initial encounter: Secondary | ICD-10-CM

## 2016-09-02 DIAGNOSIS — W050XXA Fall from non-moving wheelchair, initial encounter: Secondary | ICD-10-CM | POA: Insufficient documentation

## 2016-09-02 DIAGNOSIS — S0083XA Contusion of other part of head, initial encounter: Secondary | ICD-10-CM | POA: Diagnosis not present

## 2016-09-02 DIAGNOSIS — Z7982 Long term (current) use of aspirin: Secondary | ICD-10-CM | POA: Insufficient documentation

## 2016-09-02 DIAGNOSIS — Y939 Activity, unspecified: Secondary | ICD-10-CM | POA: Diagnosis not present

## 2016-09-02 DIAGNOSIS — I10 Essential (primary) hypertension: Secondary | ICD-10-CM | POA: Diagnosis not present

## 2016-09-02 DIAGNOSIS — Y929 Unspecified place or not applicable: Secondary | ICD-10-CM | POA: Insufficient documentation

## 2016-09-02 DIAGNOSIS — S0990XA Unspecified injury of head, initial encounter: Secondary | ICD-10-CM

## 2016-09-02 NOTE — ED Notes (Signed)
Pt's c collar removed by dr Adriana Simascook

## 2016-09-02 NOTE — Discharge Instructions (Signed)
CT head showed no acute findings. Follow-up your primary care doctor.

## 2016-09-02 NOTE — ED Provider Notes (Signed)
[facility]-EMERGENCY DEPT Provider Note   CSN: [id number] Arrival date & time: 09/02/16  2050    By signing my name below, I, Joan Mathis, attest that this documentation has been prepared under the direction and in the presence of Donnetta Hutching, MD. Electronically Signed: Valentino Mathis, ED Scribe. 09/02/16. 9:54 PM.  History   Chief Complaint Chief Complaint  Patient presents with  . Fall   The history is provided by medical records and the EMS personnel. No language interpreter was used.   LEVEL 5 CAVEAT: HPI and ROS limited due to dementia.  HPI Comments: Joan Mathis is a 83 y.o. female with PMHx of dementia, HTN, hypercholesteremia, seizures brought in by ambulance who presents to the Emergency Department presenting with fall that occurred this evening. Per nurse note, pt fell out of her wheelchair. EMS placed a neck brace on pt PTA. Pt was unable to provide any meaningful hx. She has no complaints at this time.   Past Medical History:  Diagnosis Date  . Acid reflux   . Arthritis   . Atherosclerotic cardiovascular disease   . Atrial fibrillation (HCC)   . Blindness    legal blindness,left eye,histor  . Dementia   . Dysphagia   . FH: dementia   . H/O: stroke    ocular stroke,   . Hypercholesteremia   . Hyperlipemia   . Hypertension   . Postmenopausal   . Right kidney mass 02/14/2016   Consistent with a renal cyst per ultrasound   . Seizures (HCC)   . Stroke [facility])    tia per nursing facilities  . Trochanteric bursitis     Patient Active Problem List   Diagnosis Date Noted  . Fecal impaction (HCC) 02/14/2016  . Urinary tract infection, site not specified 02/14/2016  . Hypokalemia 02/14/2016  . Anemia due to other cause 02/14/2016  . Chronic atrial fibrillation (HCC) 02/14/2016  . Thrombocytopenia (HCC) 02/14/2016  . Seizure disorder (HCC) 02/14/2016  . Right kidney mass 02/14/2016  . Dehydration   . Sigmoid volvulus (HCC) 02/12/2016  . Hypotension 02/12/2016    . Abdominal pain 02/12/2016  . Altered mental status 02/12/2016  . Hyperlipidemia 04/03/2015  . Dementia without behavioral disturbance 04/03/2015  . Dementia with behavioral disturbance   . Atrial fibrillation with RVR (HCC)   . TIA (transient ischemic attack) 04/02/2015  . HYPERLIPIDEMIA-MIXED 05/10/2010  . Essential hypertension, benign 05/10/2010  . CAD, NATIVE VESSEL 05/10/2010    History reviewed. No pertinent surgical history.  OB History    No data available       Home Medications    Prior to Admission medications   Medication Sig Start Date End Date Taking? Authorizing Provider  acetaminophen (TYLENOL) 325 MG tablet Take 650 mg by mouth every 6 (six) hours as needed.   Yes Historical Provider, MD  Amino Acids-Protein Hydrolys (FEEDING SUPPLEMENT, PRO-STAT SUGAR FREE 64,) LIQD Take 30 mLs by mouth 2 (two) times daily. 02/15/16  Yes Elliot Cousin, MD  aspirin 81 MG EC tablet Take 1 tablet (81 mg total) by mouth at bedtime. Swallow whole. 02/15/16  Yes Elliot Cousin, MD  clonazePAM (KLONOPIN) 0.5 MG tablet Take 0.25 mg by mouth 2 (two) times daily.   Yes Historical Provider, MD  cyanocobalamin 2000 MCG tablet Take 2,000 mcg by mouth daily.   Yes Historical Provider, MD  Eslicarbazepine Acetate 400 MG TABS Take 1 tablet by mouth daily.   Yes Historical Provider, MD  levETIRAcetam (KEPPRA) 500 MG tablet Take 500 mg by  mouth 2 (two) times daily.   Yes Historical Provider, MD  Magnesium Oxide 200 MG TABS Take 1 tablet (200 mg total) by mouth daily. 02/15/16  Yes Elliot Cousin, MD  metoprolol tartrate (LOPRESSOR) 25 MG tablet Take 25 mg by mouth daily.    Yes Historical Provider, MD  mirtazapine (REMERON) 7.5 MG tablet Take 7.5 mg by mouth at bedtime.   Yes Historical Provider, MD  omeprazole (PRILOSEC) 20 MG capsule Take 20 mg by mouth daily.   Yes Historical Provider, MD  polyethylene glycol (MIRALAX / GLYCOLAX) packet Take 17 g by mouth 2 (two) times daily. 02/15/16  Yes Elliot Cousin, MD  potassium chloride SA (K-DUR,KLOR-CON) 20 MEQ tablet Take 20 mEq by mouth 3 (three) times daily.   Yes Historical Provider, MD  sertraline (ZOLOFT) 25 MG tablet Take 25 mg by mouth daily.   Yes Historical Provider, MD    Family History History reviewed. No pertinent family history.  Social History Social History  Substance Use Topics  . Smoking status: Never Smoker  . Smokeless tobacco: Never Used  . Alcohol use No     Allergies   Patient has no known allergies.   Review of Systems Review of Systems  All other systems reviewed and are negative.   LEVEL 5 CAVEAT: HPI and ROS limited due to dementia.   Unable to perform ROS. Dementia.   Physical Exam Updated Vital Signs BP 122/73 (BP Location: Right Arm)   Pulse 74   Temp 97.8 F (36.6 C) (Oral)   Resp 17   Ht 5\' 8"  (1.727 m)   Wt 130 lb (59 kg)   SpO2 99%   BMI 19.77 kg/m   Physical Exam  Constitutional: She is oriented to person, place, and time. She appears well-nourished.  extremely demented./   HENT:  Head: Normocephalic and atraumatic.  Eyes: Conjunctivae are normal.  Neck: Neck supple.  Cardiovascular: Normal rate and regular rhythm.   Pulmonary/Chest: Effort normal and breath sounds normal.  Abdominal: Soft. Bowel sounds are normal.  Musculoskeletal: Normal range of motion.  Does not appear to have any bony tenderness.   Neurological: She is alert and oriented to person, place, and time.  Severe dementia.   Skin: Skin is warm and dry.  2 cm hematoma to her left forehead.   Psychiatric: She has a normal mood and affect. Her behavior is normal.  Nursing note and vitals reviewed.    ED Treatments / Results   DIAGNOSTIC STUDIES: Oxygen Saturation is 93% on RA, adequate by my interpretation.    COORDINATION OF CARE: 9:32 PM Discussed treatment plan with pt at bedside which includes head CT scan and pt agreed to plan.   Labs (all labs ordered are listed, but only abnormal results  are displayed) Labs Reviewed - No data to display  EKG  EKG Interpretation None       Radiology Ct Head Wo Contrast  Result Date: 09/02/2016 CLINICAL DATA:  Fall out of wheelchair, history of dementia EXAM: CT HEAD WITHOUT CONTRAST TECHNIQUE: Contiguous axial images were obtained from the base of the skull through the vertex without intravenous contrast. COMPARISON:  CT 02/12/2016, MRI 06/27/2015 FINDINGS: Brain: No acute territorial infarction or intracranial hemorrhage. No focal mass, mass effect or midline shift. Moderate atrophy. Moderate periventricular and deep white matter small vessel changes. Ventricles are stable in size. Vascular: No hyperdense vessels.  Carotid artery calcifications. Skull: No skull fracture. Mastoid air cells demonstrate minimal inferior opacification on the right. Sinuses/Orbits:  Minimal mucosal thickening within the ethmoid sinuses. No acute orbital abnormality. Other: Moderate left forehead scalp hematoma IMPRESSION: No definite CT evidence for acute intracranial abnormality. Moderate left forehead hematoma Electronically Signed   By: Jasmine PangKim  Fujinaga M.D.   On: 09/02/2016 22:07    Procedures Procedures (including critical care time)  Medications Ordered in ED Medications - No data to display   Initial Impression / Assessment and Plan / ED Course  I have reviewed the triage vital signs and the nursing notes.  Pertinent labs & imaging results that were available during my care of the patient were reviewed by me and considered in my medical decision making (see chart for details).     Patient has profound dementia. No bony extremity tenderness. CT head negative.  Final Clinical Impressions(s) / ED Diagnoses   Final diagnoses:  Fall, initial encounter  Minor head injury, initial encounter    New Prescriptions New Prescriptions   No medications on file    I personally performed the services described in this documentation, which was scribed in my  presence. The recorded information has been reviewed and is accurate.      Donnetta HutchingBrian Franklin Baumbach, MD 09/02/16 [phone]

## 2016-09-02 NOTE — ED Triage Notes (Signed)
Pt brought in by [facility] for c/o fall out of wheelchair; pt has large hematoma to left forehead and c/o left shoulder pain

## 2016-11-19 ENCOUNTER — Other Ambulatory Visit (HOSPITAL_COMMUNITY): Payer: Self-pay | Admitting: Internal Medicine

## 2016-11-19 ENCOUNTER — Ambulatory Visit (HOSPITAL_COMMUNITY)
Admission: RE | Admit: 2016-11-19 | Discharge: 2016-11-19 | Disposition: A | Discharge status: Home / Self Care | Payer: Medicare Other | Source: Ambulatory Visit | Attending: Internal Medicine | Admitting: Internal Medicine

## 2016-11-19 DIAGNOSIS — W19XXXA Unspecified fall, initial encounter: Secondary | ICD-10-CM

## 2016-11-19 DIAGNOSIS — R51 Headache: Secondary | ICD-10-CM

## 2016-11-19 DIAGNOSIS — X58XXXA Exposure to other specified factors, initial encounter: Secondary | ICD-10-CM | POA: Diagnosis not present

## 2016-11-19 DIAGNOSIS — R519 Headache, unspecified: Secondary | ICD-10-CM

## 2016-11-19 DIAGNOSIS — T148XXA Other injury of unspecified body region, initial encounter: Secondary | ICD-10-CM

## 2016-11-19 DIAGNOSIS — S0083XA Contusion of other part of head, initial encounter: Secondary | ICD-10-CM | POA: Diagnosis present

## 2016-11-19 DIAGNOSIS — G319 Degenerative disease of nervous system, unspecified: Secondary | ICD-10-CM | POA: Insufficient documentation

## 2016-11-19 DIAGNOSIS — I6782 Cerebral ischemia: Secondary | ICD-10-CM | POA: Diagnosis not present

## 2016-11-19 DIAGNOSIS — R5383 Other fatigue: Secondary | ICD-10-CM

## 2017-03-11 ENCOUNTER — Encounter (HOSPITAL_COMMUNITY): Payer: Self-pay | Admitting: Emergency Medicine

## 2017-03-11 ENCOUNTER — Emergency Department (HOSPITAL_COMMUNITY)
Admission: EM | Admit: 2017-03-11 | Discharge: 2017-03-11 | Disposition: A | Discharge status: Skilled Nursing Facility | Payer: Medicare Other | Attending: Emergency Medicine | Admitting: Emergency Medicine

## 2017-03-11 ENCOUNTER — Emergency Department (HOSPITAL_COMMUNITY): Payer: Medicare Other

## 2017-03-11 DIAGNOSIS — W19XXXA Unspecified fall, initial encounter: Secondary | ICD-10-CM | POA: Insufficient documentation

## 2017-03-11 DIAGNOSIS — I2510 Atherosclerotic heart disease of native coronary artery without angina pectoris: Secondary | ICD-10-CM | POA: Insufficient documentation

## 2017-03-11 DIAGNOSIS — I10 Essential (primary) hypertension: Secondary | ICD-10-CM | POA: Insufficient documentation

## 2017-03-11 DIAGNOSIS — Y939 Activity, unspecified: Secondary | ICD-10-CM | POA: Insufficient documentation

## 2017-03-11 DIAGNOSIS — S161XXA Strain of muscle, fascia and tendon at neck level, initial encounter: Secondary | ICD-10-CM | POA: Insufficient documentation

## 2017-03-11 DIAGNOSIS — F0390 Unspecified dementia without behavioral disturbance: Secondary | ICD-10-CM | POA: Insufficient documentation

## 2017-03-11 DIAGNOSIS — S01111A Laceration without foreign body of right eyelid and periocular area, initial encounter: Secondary | ICD-10-CM | POA: Diagnosis not present

## 2017-03-11 DIAGNOSIS — S0990XA Unspecified injury of head, initial encounter: Secondary | ICD-10-CM | POA: Diagnosis present

## 2017-03-11 DIAGNOSIS — Y92129 Unspecified place in nursing home as the place of occurrence of the external cause: Secondary | ICD-10-CM | POA: Diagnosis not present

## 2017-03-11 DIAGNOSIS — Y999 Unspecified external cause status: Secondary | ICD-10-CM | POA: Diagnosis not present

## 2017-03-11 NOTE — Discharge Instructions (Signed)
You were seen in the Emergency Department (ED) today for a head injury.  Based on your evaluation, you may have sustained a concussion (or bruise) to your brain.  If you had a CT scan done, it did not show any evidence of serious injury or bleeding.   ° °Symptoms to expect from a concussion include nausea, mild to moderate headache, difficulty concentrating or sleeping, and mild lightheadedness.  These symptoms should improve over the next few days to weeks, but it may take many weeks before you feel back to normal.  Return to the emergency department or follow-up with your primary care doctor if your symptoms are not improving over this time. ° °Signs of a more serious head injury include vomiting, severe headache, excessive sleepiness or confusion, and weakness or numbness in your face, arms or legs.  Return immediately to the Emergency Department if you experience any of these more concerning symptoms.   ° °Rest, avoid strenuous physical or mental activity, and avoid activities that could potentially result in another head injury until all your symptoms from this head injury are completely resolved for at least 2-3 weeks.  You may take acetaminophen over the counter according to label instructions for mild headache or scalp soreness. ° °

## 2017-03-11 NOTE — ED Notes (Signed)
EMS here for pt
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
## 2017-03-11 NOTE — ED Triage Notes (Signed)
[facility] [place], unwitnessed fall. Pt found on the floor.  Lac above rt eyebrow, no active bleeding.  Pt confused which is her baseline.

## 2017-03-24 ENCOUNTER — Encounter (HOSPITAL_COMMUNITY): Payer: Self-pay

## 2017-03-24 ENCOUNTER — Observation Stay (HOSPITAL_COMMUNITY)
Admission: EM | Admit: 2017-03-24 | Discharge: 2017-03-25 | Disposition: A | Discharge status: Home / Self Care | Payer: Medicare Other | Attending: Internal Medicine | Admitting: Internal Medicine

## 2017-03-24 DIAGNOSIS — E860 Dehydration: Secondary | ICD-10-CM | POA: Diagnosis present

## 2017-03-24 DIAGNOSIS — F0280 Dementia in other diseases classified elsewhere without behavioral disturbance: Secondary | ICD-10-CM

## 2017-03-24 DIAGNOSIS — E876 Hypokalemia: Principal | ICD-10-CM | POA: Insufficient documentation

## 2017-03-24 DIAGNOSIS — G40909 Epilepsy, unspecified, not intractable, without status epilepticus: Secondary | ICD-10-CM | POA: Diagnosis not present

## 2017-03-24 DIAGNOSIS — R531 Weakness: Secondary | ICD-10-CM | POA: Diagnosis present

## 2017-03-24 DIAGNOSIS — Z8673 Personal history of transient ischemic attack (TIA), and cerebral infarction without residual deficits: Secondary | ICD-10-CM | POA: Diagnosis not present

## 2017-03-24 DIAGNOSIS — Z79899 Other long term (current) drug therapy: Secondary | ICD-10-CM | POA: Insufficient documentation

## 2017-03-24 DIAGNOSIS — F0390 Unspecified dementia without behavioral disturbance: Secondary | ICD-10-CM | POA: Insufficient documentation

## 2017-03-24 DIAGNOSIS — I2510 Atherosclerotic heart disease of native coronary artery without angina pectoris: Secondary | ICD-10-CM | POA: Insufficient documentation

## 2017-03-24 DIAGNOSIS — Z7982 Long term (current) use of aspirin: Secondary | ICD-10-CM | POA: Insufficient documentation

## 2017-03-24 DIAGNOSIS — G309 Alzheimer's disease, unspecified: Secondary | ICD-10-CM | POA: Diagnosis not present

## 2017-03-24 DIAGNOSIS — I10 Essential (primary) hypertension: Secondary | ICD-10-CM | POA: Diagnosis present

## 2017-03-24 LAB — CBC WITH DIFFERENTIAL/PLATELET
BASOS ABS: 0 10*3/uL (ref 0.0–0.1)
Basophils Relative: 0 %
EOS PCT: 4 %
Eosinophils Absolute: 0.2 10*3/uL (ref 0.0–0.7)
HEMATOCRIT: 37.1 % (ref 36.0–46.0)
Hemoglobin: 12 g/dL (ref 12.0–15.0)
LYMPHS ABS: 1.1 10*3/uL (ref 0.7–4.0)
LYMPHS PCT: 23 %
MCH: 30.6 pg (ref 26.0–34.0)
MCHC: 32.3 g/dL (ref 30.0–36.0)
MCV: 94.6 fL (ref 78.0–100.0)
MONO ABS: 0.4 10*3/uL (ref 0.1–1.0)
Monocytes Relative: 9 %
NEUTROS ABS: 2.9 10*3/uL (ref 1.7–7.7)
Neutrophils Relative %: 64 %
PLATELETS: 153 10*3/uL (ref 150–400)
RBC: 3.92 MIL/uL (ref 3.87–5.11)
RDW: 14.2 % (ref 11.5–15.5)
WBC: 4.6 10*3/uL (ref 4.0–10.5)

## 2017-03-24 LAB — BASIC METABOLIC PANEL
ANION GAP: 5 (ref 5–15)
BUN: 23 mg/dL — AB (ref 6–20)
CO2: 38 mmol/L — AB (ref 22–32)
Calcium: 8.4 mg/dL — ABNORMAL LOW (ref 8.9–10.3)
Chloride: 103 mmol/L (ref 101–111)
Creatinine, Ser: 0.79 mg/dL (ref 0.44–1.00)
GFR calc Af Amer: >60 mL/min (ref >60)
GFR calc non Af Amer: >60 mL/min (ref >60)
GLUCOSE: 112 mg/dL — AB (ref 65–99)
POTASSIUM: 2.1 mmol/L — AB (ref 3.5–5.1)
Sodium: 146 mmol/L — ABNORMAL HIGH (ref 135–145)

## 2017-03-24 LAB — URINALYSIS, ROUTINE W REFLEX MICROSCOPIC
BACTERIA UA: NONE SEEN
Bilirubin Urine: NEGATIVE
Glucose, UA: NEGATIVE mg/dL
Hgb urine dipstick: NEGATIVE
KETONES UR: NEGATIVE mg/dL
LEUKOCYTES UA: NEGATIVE
Nitrite: NEGATIVE
PROTEIN: 30 mg/dL — AB
Specific Gravity, Urine: 1.023 (ref 1.005–1.030)
pH: 5 (ref 5.0–8.0)

## 2017-03-24 LAB — MRSA PCR SCREENING: MRSA BY PCR: NEGATIVE

## 2017-03-24 LAB — MAGNESIUM: MAGNESIUM: 2.2 mg/dL (ref 1.7–2.4)

## 2017-03-24 MED ORDER — MIRTAZAPINE 15 MG PO TABS
7.5000 mg | ORAL_TABLET | Freq: Every day | ORAL | Status: DC
  Administered 2017-03-24: 7.5 mg via ORAL
  Filled 2017-03-24: qty 1

## 2017-03-24 MED ORDER — PANTOPRAZOLE SODIUM 40 MG PO TBEC
40.0000 mg | DELAYED_RELEASE_TABLET | Freq: Every day | ORAL | Status: DC
  Administered 2017-03-25: 40 mg via ORAL
  Filled 2017-03-24: qty 1

## 2017-03-24 MED ORDER — ESLICARBAZEPINE ACETATE 400 MG PO TABS: 1.0000 | ORAL_TABLET | Freq: Every day | ORAL | Status: DC

## 2017-03-24 MED ORDER — ACETAMINOPHEN 650 MG RE SUPP: 650.0000 mg | Freq: Four times a day (QID) | RECTAL | Status: DC | PRN

## 2017-03-24 MED ORDER — ONDANSETRON HCL 4 MG PO TABS: 4.0000 mg | ORAL_TABLET | Freq: Four times a day (QID) | ORAL | Status: DC | PRN

## 2017-03-24 MED ORDER — ONDANSETRON HCL 4 MG/2ML IJ SOLN: 4.0000 mg | Freq: Four times a day (QID) | INTRAMUSCULAR | Status: DC | PRN

## 2017-03-24 MED ORDER — METOPROLOL TARTRATE 25 MG PO TABS
25.0000 mg | ORAL_TABLET | Freq: Every day | ORAL | Status: DC
  Administered 2017-03-25: 25 mg via ORAL
  Filled 2017-03-24: qty 1

## 2017-03-24 MED ORDER — LEVETIRACETAM 500 MG PO TABS
500.0000 mg | ORAL_TABLET | Freq: Two times a day (BID) | ORAL | Status: DC
  Administered 2017-03-24 – 2017-03-25 (×2): 500 mg via ORAL
  Filled 2017-03-24 (×2): qty 1

## 2017-03-24 MED ORDER — POTASSIUM CHLORIDE 10 MEQ/100ML IV SOLN
10.0000 meq | Freq: Once | INTRAVENOUS | Status: AC
  Administered 2017-03-24: 10 meq via INTRAVENOUS
  Filled 2017-03-24: qty 100

## 2017-03-24 MED ORDER — POTASSIUM CHLORIDE 10 MEQ/100ML IV SOLN
INTRAVENOUS | Status: AC
  Administered 2017-03-24: 10 meq
  Filled 2017-03-24: qty 100

## 2017-03-24 MED ORDER — SODIUM CHLORIDE 0.9 % IV BOLUS (SEPSIS)
1000.0000 mL | Freq: Once | INTRAVENOUS | Status: AC
  Administered 2017-03-24: 1000 mL via INTRAVENOUS

## 2017-03-24 MED ORDER — POTASSIUM CHLORIDE CRYS ER 20 MEQ PO TBCR
40.0000 meq | EXTENDED_RELEASE_TABLET | Freq: Once | ORAL | Status: AC
  Administered 2017-03-24: 40 meq via ORAL
  Filled 2017-03-24: qty 2

## 2017-03-24 MED ORDER — POLYETHYLENE GLYCOL 3350 17 G PO PACK
17.0000 g | PACK | Freq: Two times a day (BID) | ORAL | Status: DC
  Administered 2017-03-24 – 2017-03-25 (×2): 17 g via ORAL
  Filled 2017-03-24 (×2): qty 1

## 2017-03-24 MED ORDER — ASPIRIN EC 81 MG PO TBEC
81.0000 mg | DELAYED_RELEASE_TABLET | Freq: Every day | ORAL | Status: DC
  Administered 2017-03-24: 81 mg via ORAL
  Filled 2017-03-24: qty 1

## 2017-03-24 MED ORDER — CLONAZEPAM 0.5 MG PO TABS
0.2500 mg | ORAL_TABLET | Freq: Two times a day (BID) | ORAL | Status: DC
  Administered 2017-03-24 – 2017-03-25 (×2): 0.25 mg via ORAL
  Filled 2017-03-24 (×2): qty 1

## 2017-03-24 MED ORDER — VITAMIN B-12 1000 MCG PO TABS
2000.0000 ug | ORAL_TABLET | Freq: Every day | ORAL | Status: DC
  Administered 2017-03-25: 2000 ug via ORAL
  Filled 2017-03-24: qty 2

## 2017-03-24 MED ORDER — MAGNESIUM OXIDE 400 (241.3 MG) MG PO TABS
200.0000 mg | ORAL_TABLET | Freq: Every day | ORAL | Status: DC
  Administered 2017-03-25: 200 mg via ORAL
  Filled 2017-03-24: qty 1

## 2017-03-24 MED ORDER — SERTRALINE HCL 50 MG PO TABS
25.0000 mg | ORAL_TABLET | Freq: Every day | ORAL | Status: DC
  Administered 2017-03-25: 25 mg via ORAL
  Filled 2017-03-24: qty 1

## 2017-03-24 MED ORDER — POTASSIUM CHLORIDE IN NACL 40-0.9 MEQ/L-% IV SOLN
INTRAVENOUS | Status: DC
  Administered 2017-03-24 – 2017-03-25 (×2): 100 mL/h via INTRAVENOUS

## 2017-03-24 MED ORDER — POTASSIUM CHLORIDE CRYS ER 20 MEQ PO TBCR: 40.0000 meq | EXTENDED_RELEASE_TABLET | Freq: Once | ORAL | Status: DC

## 2017-03-24 MED ORDER — PRO-STAT SUGAR FREE PO LIQD
30.0000 mL | Freq: Two times a day (BID) | ORAL | Status: DC
  Administered 2017-03-24 – 2017-03-25 (×2): 30 mL via ORAL
  Filled 2017-03-24 (×2): qty 30

## 2017-03-24 MED ORDER — ACETAMINOPHEN 325 MG PO TABS: 650.0000 mg | ORAL_TABLET | Freq: Four times a day (QID) | ORAL | Status: DC | PRN

## 2017-03-24 MED ORDER — POTASSIUM CHLORIDE 10 MEQ/100ML IV SOLN
10.0000 meq | INTRAVENOUS | Status: AC
  Administered 2017-03-24 – 2017-03-25 (×6): 10 meq via INTRAVENOUS
  Filled 2017-03-24 (×6): qty 100

## 2017-03-24 MED ORDER — MAGNESIUM OXIDE -MG SUPPLEMENT 200 MG PO TABS: 200.0000 mg | ORAL_TABLET | Freq: Every day | ORAL | Status: DC

## 2017-03-24 MED ORDER — POTASSIUM CHLORIDE CRYS ER 20 MEQ PO TBCR
20.0000 meq | EXTENDED_RELEASE_TABLET | Freq: Three times a day (TID) | ORAL | Status: DC
  Administered 2017-03-24 – 2017-03-25 (×2): 20 meq via ORAL
  Filled 2017-03-24 (×2): qty 1

## 2017-03-24 MED ORDER — ASPIRIN 81 MG PO TBEC: 81.0000 mg | DELAYED_RELEASE_TABLET | Freq: Every day | ORAL | Status: DC

## 2017-03-24 MED ORDER — POTASSIUM CHLORIDE 10 MEQ/100ML IV SOLN
10.0000 meq | Freq: Once | INTRAVENOUS | Status: AC
  Administered 2017-03-24: 10 meq via INTRAVENOUS

## 2017-03-24 MED ORDER — ENOXAPARIN SODIUM 40 MG/0.4ML ~~LOC~~ SOLN
40.0000 mg | SUBCUTANEOUS | Status: DC
  Administered 2017-03-24: 40 mg via SUBCUTANEOUS
  Filled 2017-03-24: qty 0.4

## 2017-03-24 NOTE — H&P (Signed)
History and Physical    Joan Mathis ZOX:[id number] DOB: April 18, 1926 DOA: 03/24/2017  PCP: Bernerd Limbo, MD  Patient coming from: SNF  I have personally briefly reviewed patient's old medical records in [facility]  Chief Complaint: generalized weakness, hypokalemia  HPI: Joan Mathis is a 83 y.o. female with medical history significant of dementia, seizure disorder, hypertension who is a resident of a skilled nursing facility. History is somewhat limited due to patient's dementia. No family is available at this time. She was sent to the emergency room today due to increasing weakness. Apparently a urine culture was recently drawn was positive for ESBL Escherichia coli. Labs were drawn and she was found to be severely hypokalemic and sent to the emergency room for evaluation. Reported history of diarrhea. She has limited by mouth intake due to dysphagia and dietary modifications.  ED Course:  Noted to be markedly hypokalemic at 2.1. She was also noted to be clinically dehydrated and had a mild elevation of BUN . CBC appears to show hemoconcentration. She's been referred for admission for correction of potassium  Review of Systems: unable to assess due to dementia  Past Medical History:  Diagnosis Date  . Acid reflux   . Arthritis   . Atherosclerotic cardiovascular disease   . Atrial fibrillation (HCC)   . Blindness    legal blindness,left eye,histor  . Dementia   . Dysphagia   . FH: dementia   . H/O: stroke    ocular stroke,   . Hypercholesteremia   . Hyperlipemia   . Hypertension   . Postmenopausal   . Right kidney mass 02/14/2016   Consistent with a renal cyst per ultrasound   . Seizures (HCC)   . Stroke [facility])    tia per nursing facilities  . Trochanteric bursitis     History reviewed. No pertinent surgical history.   reports that she has never smoked. She has never used smokeless tobacco. She reports that she does not drink alcohol or use drugs.  No  Known Allergies  Family history: unable to assess due to dementia  Prior to Admission medications   Medication Sig Start Date End Date Taking? Authorizing Provider  Amino Acids-Protein Hydrolys (FEEDING SUPPLEMENT, PRO-STAT SUGAR FREE 64,) LIQD Take 30 mLs by mouth 2 (two) times daily. 02/15/16  Yes Elliot Cousin, MD  aspirin 81 MG EC tablet Take 1 tablet (81 mg total) by mouth at bedtime. Swallow whole. 02/15/16  Yes Elliot Cousin, MD  clonazePAM (KLONOPIN) 0.5 MG tablet Take 0.25 mg by mouth 2 (two) times daily.   Yes [provider]  cyanocobalamin 2000 MCG tablet Take 2,000 mcg by mouth daily.   Yes [provider]  Eslicarbazepine Acetate 400 MG TABS Take 1 tablet by mouth daily.   Yes [provider]  levETIRAcetam (KEPPRA) 500 MG tablet Take 500 mg by mouth 2 (two) times daily.   Yes [provider]  Magnesium Oxide 200 MG TABS Take 1 tablet (200 mg total) by mouth daily. 02/15/16  Yes Elliot Cousin, MD  metoprolol tartrate (LOPRESSOR) 25 MG tablet Take 25 mg by mouth daily.    Yes [provider]  mirtazapine (REMERON) 7.5 MG tablet Take 7.5 mg by mouth at bedtime.   Yes [provider]  omeprazole (PRILOSEC) 20 MG capsule Take 20 mg by mouth daily.   Yes [provider]  polyethylene glycol (MIRALAX / GLYCOLAX) packet Take 17 g by mouth 2 (two) times daily. 02/15/16  Yes Elliot Cousin,  MD  potassium chloride SA (K-DUR,KLOR-CON) 20 MEQ tablet Take 20 mEq by mouth 3 (three) times daily.   Yes [provider]  sertraline (ZOLOFT) 25 MG tablet Take 25 mg by mouth daily.   Yes [provider]  acetaminophen (TYLENOL) 325 MG tablet Take 650 mg by mouth every 6 (six) hours as needed.    [provider]    Physical Exam: Vitals:   03/24/17 1700 03/24/17 1730 03/24/17 1751 03/24/17 1755  BP: (!) 152/95 (!) 142/72 (!) 189/100 (!) 168/95  Pulse: (!) 55     Resp: 14 14 16    Temp:   97.9 F (36.6 C)     TempSrc:   Axillary   SpO2: 97%  (!) 83%   Weight:   62 kg (136 lb 11 oz)   Height:   5\' 7"  (1.702 m)     Constitutional: NAD, calm, comfortable Vitals:   03/24/17 1700 03/24/17 1730 03/24/17 1751 03/24/17 1755  BP: (!) 152/95 (!) 142/72 (!) 189/100 (!) 168/95  Pulse: (!) 55     Resp: 14 14 16    Temp:   97.9 F (36.6 C)   TempSrc:   Axillary   SpO2: 97%  (!) 83%   Weight:   62 kg (136 lb 11 oz)   Height:   5\' 7"  (1.702 m)    Eyes: PERRL, lids and conjunctivae normal ENMT: Mucous membranes are dry. Posterior pharynx clear of any exudate or lesions.Normal dentition.  Neck: normal, supple, no masses, no thyromegaly Respiratory: clear to auscultation bilaterally, no wheezing, no crackles. Normal respiratory effort. No accessory muscle use.  Cardiovascular: Regular rate and rhythm, no murmurs / rubs / gallops. No extremity edema. 2+ pedal pulses. No carotid bruits.  Abdomen: no tenderness, no masses palpated. No hepatosplenomegaly. Bowel sounds positive.  Musculoskeletal: no clubbing / cyanosis. No joint deformity upper and lower extremities. Good ROM, no contractures. Normal muscle tone.  Skin: no rashes, lesions, ulcers. No induration Neurologic: CN 2-12 grossly intact. Sensation intact, DTR normal. Strength 5/5 in all 4.  Psychiatric: confused    Labs on Admission: I have personally reviewed following labs and imaging studies  CBC:  Recent Labs Lab 03/24/17 1245  WBC 4.6  NEUTROABS 2.9  HGB 12.0  HCT 37.1  MCV 94.6  PLT 153   Basic Metabolic Panel:  Recent Labs Lab 03/24/17 1245  NA 146*  K 2.1*  CL 103  CO2 38*  GLUCOSE 112*  BUN 23*  CREATININE 0.79  CALCIUM 8.4*  MG 2.2   GFR: Estimated Creatinine Clearance: 46.4 mL/min (by C-G formula based on SCr of 0.79 mg/dL). Liver Function Tests: No results for input(s): AST, ALT, ALKPHOS, BILITOT, PROT, ALBUMIN in the last 168 hours. No results for input(s): LIPASE, AMYLASE in the last 168 hours. No results  for input(s): AMMONIA in the last 168 hours. Coagulation Profile: No results for input(s): INR, PROTIME in the last 168 hours. Cardiac Enzymes: No results for input(s): CKTOTAL, CKMB, CKMBINDEX, TROPONINI in the last 168 hours. BNP (last 3 results) No results for input(s): PROBNP in the last 8760 hours. HbA1C: No results for input(s): HGBA1C in the last 72 hours. CBG: No results for input(s): GLUCAP in the last 168 hours. Lipid Profile: No results for input(s): CHOL, HDL, LDLCALC, TRIG, CHOLHDL, LDLDIRECT in the last 72 hours. Thyroid Function Tests: No results for input(s): TSH, T4TOTAL, FREET4, T3FREE, THYROIDAB in the last 72 hours. Anemia Panel: No results for input(s): VITAMINB12, FOLATE, FERRITIN, TIBC, IRON,  RETICCTPCT in the last 72 hours. Urine analysis:    Component Value Date/Time   COLORURINE YELLOW 03/24/2017 1245   APPEARANCEUR CLEAR 03/24/2017 1245   LABSPEC 1.023 03/24/2017 1245   PHURINE 5.0 03/24/2017 1245   GLUCOSEU NEGATIVE 03/24/2017 1245   HGBUR NEGATIVE 03/24/2017 1245   BILIRUBINUR NEGATIVE 03/24/2017 1245   KETONESUR NEGATIVE 03/24/2017 1245   PROTEINUR 30 (A) 03/24/2017 1245   UROBILINOGEN 2.0 (H) 04/02/2015 1945   NITRITE NEGATIVE 03/24/2017 1245   LEUKOCYTESUR NEGATIVE 03/24/2017 1245    Radiological Exams on Admission: No results found.   Assessment/Plan Principal Problem:   Hypokalemia Active Problems:   Essential hypertension, benign   Dementia without behavioral disturbance   Dehydration   Seizure disorder (HCC)   Generalized weakness    1. Hypokalemia. Suspect this relates to decreased by mouth intake. Will supplement intravenously and orally. Magnesium checked and is in normal range. 2. Dehydration. Continue with IV fluids. 3. Hypertension. Continue metoprolol. 4. Seizure disorder. Continue on Keppra 5. Generalized weakness. Suspect this related to hypokalemia. Will plan to return to skilled nurse facility on discharge.  DVT  prophylaxis: lovenox Code Status: DNR Family Communication: no family present Disposition Plan: discharge back to SNF when improved Consults called:  Admission status: observation/telemetry   MEMON,JEHANZEB MD [facility] Pager [phone]  If 7PM-7AM, please contact night-coverage [email or web address] Password [email or web address]  03/24/2017, 6:18 PM

## 2017-03-24 NOTE — ED Provider Notes (Signed)
[facility]-EMERGENCY DEPT Provider Note   CSN: [id number] Arrival date & time: 03/24/17  1200     History   Chief Complaint Chief Complaint  Patient presents with  . Weakness    HPI Joan Mathis is a 83 y.o. female.  Patient has shown increased weakness at the nursing home. They checked lab work including a urine. It showed hypokalemia and urinary tract infection. She was sent to the emergency department for treatment   The history is provided by the patient. No language interpreter was used.  Weakness  Primary symptoms include no focal weakness. This is a recurrent problem. The problem has not changed since onset.There was no focality noted. There has been no fever. Pertinent negatives include no shortness of breath. There were no medications administered prior to arrival. Associated medical issues do not include trauma.    Past Medical History:  Diagnosis Date  . Acid reflux   . Arthritis   . Atherosclerotic cardiovascular disease   . Atrial fibrillation (HCC)   . Blindness    legal blindness,left eye,histor  . Dementia   . Dysphagia   . FH: dementia   . H/O: stroke    ocular stroke,   . Hypercholesteremia   . Hyperlipemia   . Hypertension   . Postmenopausal   . Right kidney mass 02/14/2016   Consistent with a renal cyst per ultrasound   . Seizures (HCC)   . Stroke [facility])    tia per nursing facilities  . Trochanteric bursitis     Patient Active Problem List   Diagnosis Date Noted  . Fecal impaction (HCC) 02/14/2016  . Urinary tract infection, site not specified 02/14/2016  . Hypokalemia 02/14/2016  . Anemia due to other cause 02/14/2016  . Chronic atrial fibrillation (HCC) 02/14/2016  . Thrombocytopenia (HCC) 02/14/2016  . Seizure disorder (HCC) 02/14/2016  . Right kidney mass 02/14/2016  . Dehydration   . Sigmoid volvulus (HCC) 02/12/2016  . Hypotension 02/12/2016  . Abdominal pain 02/12/2016  . Altered mental status 02/12/2016  . Hyperlipidemia 04/03/2015   . Dementia without behavioral disturbance 04/03/2015  . Dementia with behavioral disturbance   . Atrial fibrillation with RVR (HCC)   . TIA (transient ischemic attack) 04/02/2015  . HYPERLIPIDEMIA-MIXED 05/10/2010  . Essential hypertension, benign 05/10/2010  . CAD, NATIVE VESSEL 05/10/2010    History reviewed. No pertinent surgical history.  OB History    No data available       Home Medications    Prior to Admission medications   Medication Sig Start Date End Date Taking? Authorizing Provider  Amino Acids-Protein Hydrolys (FEEDING SUPPLEMENT, PRO-STAT SUGAR FREE 64,) LIQD Take 30 mLs by mouth 2 (two) times daily. 02/15/16  Yes Elliot Cousin, MD  aspirin 81 MG EC tablet Take 1 tablet (81 mg total) by mouth at bedtime. Swallow whole. 02/15/16  Yes Elliot Cousin, MD  clonazePAM (KLONOPIN) 0.5 MG tablet Take 0.25 mg by mouth 2 (two) times daily.   Yes [provider]  cyanocobalamin 2000 MCG tablet Take 2,000 mcg by mouth daily.   Yes [provider]  Eslicarbazepine Acetate 400 MG TABS Take 1 tablet by mouth daily.   Yes [provider]  levETIRAcetam (KEPPRA) 500 MG tablet Take 500 mg by mouth 2 (two) times daily.   Yes [provider]  Magnesium Oxide 200 MG TABS Take 1 tablet (200 mg total) by mouth daily. 02/15/16  Yes Elliot Cousin, MD  metoprolol tartrate (LOPRESSOR) 25 MG tablet Take 25 mg by mouth  daily.    Yes [provider]  mirtazapine (REMERON) 7.5 MG tablet Take 7.5 mg by mouth at bedtime.   Yes [provider]  omeprazole (PRILOSEC) 20 MG capsule Take 20 mg by mouth daily.   Yes [provider]  polyethylene glycol (MIRALAX / GLYCOLAX) packet Take 17 g by mouth 2 (two) times daily. 02/15/16  Yes Elliot Cousin, MD  potassium chloride SA (K-DUR,KLOR-CON) 20 MEQ tablet Take 20 mEq by mouth 3 (three) times daily.   Yes [provider]  sertraline (ZOLOFT) 25 MG tablet Take 25 mg by mouth daily.   Yes  [provider]  acetaminophen (TYLENOL) 325 MG tablet Take 650 mg by mouth every 6 (six) hours as needed.    [provider]    Family History No family history on file.  Social History Social History  Substance Use Topics  . Smoking status: Never Smoker  . Smokeless tobacco: Never Used  . Alcohol use No     Allergies   Patient has no known allergies.   Review of Systems Review of Systems  Unable to perform ROS: Dementia  Respiratory: Negative for shortness of breath.   Neurological: Positive for weakness. Negative for focal weakness.     Physical Exam Updated Vital Signs BP (!) 150/77   Pulse (!) 53   Temp 97.7 F (36.5 C) (Oral)   Resp 20   Ht 5\' 7"  (1.702 m)   Wt 59 kg (130 lb)   SpO2 97%   BMI 20.36 kg/m   Physical Exam  Constitutional: She appears well-developed.  HENT:  Head: Normocephalic.  Eyes: Conjunctivae and EOM are normal. No scleral icterus.  Neck: Neck supple. No thyromegaly present.  Cardiovascular: Normal rate and regular rhythm.  Exam reveals no gallop and no friction rub.   No murmur heard. Pulmonary/Chest: No stridor. She has no wheezes. She has no rales. She exhibits no tenderness.  Abdominal: She exhibits no distension. There is no tenderness. There is no rebound.  Musculoskeletal: Normal range of motion. She exhibits no edema.  Lymphadenopathy:    She has no cervical adenopathy.  Neurological: She is alert. She exhibits normal muscle tone. Coordination normal.  Patient oriented to person only  Skin: No rash noted. No erythema.     ED Treatments / Results  Labs (all labs ordered are listed, but only abnormal results are displayed) Labs Reviewed  BASIC METABOLIC PANEL - Abnormal; Notable for the following:       Result Value   Sodium 146 (*)    Potassium 2.1 (*)    CO2 38 (*)    Glucose, Bld 112 (*)    BUN 23 (*)    Calcium 8.4 (*)    All other components within normal limits  URINALYSIS, ROUTINE W REFLEX  MICROSCOPIC - Abnormal; Notable for the following:    Protein, ur 30 (*)    Squamous Epithelial / LPF 0-5 (*)    All other components within normal limits  CBC WITH DIFFERENTIAL/PLATELET    EKG  EKG Interpretation None       Radiology No results found.  Procedures Procedures (including critical care time)  Medications Ordered in ED Medications  potassium chloride 10 mEq in 100 mL IVPB (not administered)  potassium chloride 10 mEq in 100 mL IVPB (10 mEq Intravenous New Bag/Given 03/24/17 1546)  sodium chloride 0.9 % bolus 1,000 mL (0 mLs Intravenous Stopped 03/24/17 1501)     Initial Impression / Assessment and Plan /  ED Course  I have reviewed the triage vital signs and the nursing notes.  Pertinent labs & imaging results that were available during my care of the patient were reviewed by me and considered in my medical decision making (see chart for details).     CRITICAL CARE Performed by: Katherinne Mofield L Total critical care time: 40 minutes Critical care time was exclusive of separately billable procedures and treating other patients. Critical care was necessary to treat or prevent imminent or life-threatening deterioration. Critical care was time spent personally by me on the following activities: development of treatment plan with patient and/or surrogate as well as nursing, discussions with consultants, evaluation of patient's response to treatment, examination of patient, obtaining history from patient or surrogate, ordering and performing treatments and interventions, ordering and review of laboratory studies, ordering and review of radiographic studies, pulse oximetry and re-evaluation of patient's condition.  Patient with hypokalemia. She will be admitted to medicine for treatment Final Clinical Impressions(s) / ED Diagnoses   Final diagnoses:  Hypokalemia    New Prescriptions New Prescriptions   No medications on file     Bethann Berkshire, MD 03/24/17  1622

## 2017-03-24 NOTE — ED Triage Notes (Signed)
Pt coming from [facility]. Pt coming for critical lab values. Per EMS states Potassium is 2.0. Was rechecked before being brought here to validate that K was that low.   Pt in NAD VSS Being moved to room 14 bc pt. Tries to get out of bed. Per EMS pt has E Coli in urine.

## 2017-03-25 DIAGNOSIS — G40909 Epilepsy, unspecified, not intractable, without status epilepticus: Secondary | ICD-10-CM | POA: Diagnosis not present

## 2017-03-25 DIAGNOSIS — G309 Alzheimer's disease, unspecified: Secondary | ICD-10-CM | POA: Diagnosis not present

## 2017-03-25 DIAGNOSIS — E876 Hypokalemia: Secondary | ICD-10-CM | POA: Diagnosis not present

## 2017-03-25 DIAGNOSIS — E860 Dehydration: Secondary | ICD-10-CM | POA: Diagnosis not present

## 2017-03-25 DIAGNOSIS — I10 Essential (primary) hypertension: Secondary | ICD-10-CM | POA: Diagnosis not present

## 2017-03-25 DIAGNOSIS — F0280 Dementia in other diseases classified elsewhere without behavioral disturbance: Secondary | ICD-10-CM | POA: Diagnosis not present

## 2017-03-25 LAB — BASIC METABOLIC PANEL
Anion gap: 6 (ref 5–15)
BUN: 18 mg/dL (ref 6–20)
CALCIUM: 8.2 mg/dL — AB (ref 8.9–10.3)
CO2: 31 mmol/L (ref 22–32)
CREATININE: 0.58 mg/dL (ref 0.44–1.00)
Chloride: 109 mmol/L (ref 101–111)
GFR calc Af Amer: >60 mL/min (ref >60)
GLUCOSE: 88 mg/dL (ref 65–99)
POTASSIUM: 3.1 mmol/L — AB (ref 3.5–5.1)
SODIUM: 146 mmol/L — AB (ref 135–145)

## 2017-03-25 LAB — CBC
HCT: 33.1 % — ABNORMAL LOW (ref 36.0–46.0)
Hemoglobin: 10.9 g/dL — ABNORMAL LOW (ref 12.0–15.0)
MCH: 30.8 pg (ref 26.0–34.0)
MCHC: 32.9 g/dL (ref 30.0–36.0)
MCV: 93.5 fL (ref 78.0–100.0)
PLATELETS: 145 10*3/uL — AB (ref 150–400)
RBC: 3.54 MIL/uL — ABNORMAL LOW (ref 3.87–5.11)
RDW: 14.3 % (ref 11.5–15.5)
WBC: 4 10*3/uL (ref 4.0–10.5)

## 2017-03-25 MED ORDER — AMLODIPINE BESYLATE 5 MG PO TABS: 5.0000 mg | ORAL_TABLET | Freq: Every day | ORAL | Status: DC

## 2017-03-25 MED ORDER — HYDRALAZINE HCL 20 MG/ML IJ SOLN: 10.0000 mg | Freq: Four times a day (QID) | INTRAMUSCULAR | Status: DC | PRN

## 2017-03-25 MED ORDER — POTASSIUM CHLORIDE CRYS ER 20 MEQ PO TBCR
40.0000 meq | EXTENDED_RELEASE_TABLET | Freq: Once | ORAL | Status: AC
  Administered 2017-03-25: 40 meq via ORAL
  Filled 2017-03-25: qty 2

## 2017-03-25 MED ORDER — AMLODIPINE BESYLATE 5 MG PO TABS
5.0000 mg | ORAL_TABLET | Freq: Every day | ORAL | Status: DC
  Administered 2017-03-25: 5 mg via ORAL
  Filled 2017-03-25: qty 1

## 2017-03-25 NOTE — Progress Notes (Signed)
Report called and given to Nurse at [facility], IV removed, tolerated well.

## 2017-03-25 NOTE — Care Management Obs Status (Signed)
MEDICARE OBSERVATION STATUS NOTIFICATION   Patient Details  Name: Joan Mathis MRN: [id number] Date of Birth: June 26, 1926   Medicare Observation Status Notification Given:  Other (see comment) (Pt DC'd < 24hrs. )    Malcolm Metro, RN 03/25/2017, 1:32 PM

## 2017-03-25 NOTE — Discharge Summary (Signed)
Physician Discharge Summary  Joan Mathis ZOX:[id number] DOB: 03-26-1926 DOA: 03/24/2017  PCP: Bernerd Limbo, MD  Admit date: 03/24/2017 Discharge date: 03/25/2017  Admitted From: SNF Disposition:  SNF  Recommendations for Outpatient Follow-up:  1. Follow up with PCP in 1-2 weeks 2. Please obtain BMP/CBC in one week  Discharge Condition: stable CODE STATUS: DNR Diet recommendation: dysphagia 1 with nectar thick  Brief/Interim Summary: Joan Mathis is a 83 y.o. female with medical history significant of dementia, seizure disorder, hypertension who is a resident of a skilled nursing facility. History is somewhat limited due to patient's dementia. No family is available at this time. She was sent to the emergency room today due to increasing weakness. Apparently a urine culture was recently drawn was positive for ESBL Escherichia coli. Labs were drawn and she was found to be severely hypokalemic and sent to the emergency room for evaluation. Reported history of diarrhea. She has limited by mouth intake due to dysphagia and dietary modifications.  Discharge Diagnoses:  Principal Problem:   Hypokalemia Active Problems:   Essential hypertension, benign   Dementia without behavioral disturbance   Dehydration   Seizure disorder (HCC)   Generalized weakness  1. Hypokalemia. Corrected with oral and IV supplementation. Magnesium checked and is in normal range. 2. Dehydration. Treated with IV fluids. 3. Hypertension. Continue metoprolol. 4. Seizure disorder. Continue on Keppra 5. Generalized weakness. Suspect this related to hypokalemia. Will plan to return to skilled nurse facility on discharge. 6. Recent diagnosis of ESBL Escherichia coli on urine culture. Urinalysis done on admission was completely normal. She is not febrile and does not have any leukocytosis. We'll not treat with further antibiotics.  Discharge Instructions  Discharge Instructions    Diet - low sodium heart  healthy    Complete by:  As directed    Increase activity slowly    Complete by:  As directed      Allergies as of 03/25/2017   No Known Allergies     Medication List    TAKE these medications   acetaminophen 325 MG tablet Commonly known as:  TYLENOL Take 650 mg by mouth every 6 (six) hours as needed.   amLODipine 5 MG tablet Commonly known as:  NORVASC Take 1 tablet (5 mg total) by mouth daily.   aspirin 81 MG EC tablet Take 1 tablet (81 mg total) by mouth at bedtime. Swallow whole.   clonazePAM 0.5 MG tablet Commonly known as:  KLONOPIN Take 0.25 mg by mouth 2 (two) times daily.   cyanocobalamin 2000 MCG tablet Take 2,000 mcg by mouth daily.   Eslicarbazepine Acetate 400 MG Tabs Take 1 tablet by mouth daily.   feeding supplement (PRO-STAT SUGAR FREE 64) Liqd Take 30 mLs by mouth 2 (two) times daily.   levETIRAcetam 500 MG tablet Commonly known as:  KEPPRA Take 500 mg by mouth 2 (two) times daily.   Magnesium Oxide 200 MG Tabs Take 1 tablet (200 mg total) by mouth daily.   metoprolol tartrate 25 MG tablet Commonly known as:  LOPRESSOR Take 25 mg by mouth daily.   mirtazapine 7.5 MG tablet Commonly known as:  REMERON Take 7.5 mg by mouth at bedtime.   omeprazole 20 MG capsule Commonly known as:  PRILOSEC Take 20 mg by mouth daily.   polyethylene glycol packet Commonly known as:  MIRALAX / GLYCOLAX Take 17 g by mouth 2 (two) times daily.   potassium chloride SA 20 MEQ tablet Commonly known as:  K-DUR,KLOR-CON Take 20 mEq  by mouth 3 (three) times daily.   sertraline 25 MG tablet Commonly known as:  ZOLOFT Take 25 mg by mouth daily.            Discharge Care Instructions        Start     Ordered   03/26/17 0000  amLODipine (NORVASC) 5 MG tablet  Daily     03/25/17 1217   03/25/17 0000  Increase activity slowly     03/25/17 1217   03/25/17 0000  Diet - low sodium heart healthy     03/25/17 1217      No Known  Allergies  Consultations:     Procedures/Studies: Ct Head Wo Contrast  Result Date: 03/11/2017 CLINICAL DATA:  Unwitnessed fall.  Laceration above the right eye. EXAM: CT HEAD WITHOUT CONTRAST CT CERVICAL SPINE WITHOUT CONTRAST TECHNIQUE: Multidetector CT imaging of the head and cervical spine was performed following the standard protocol without intravenous contrast. Multiplanar CT image reconstructions of the cervical spine were also generated. COMPARISON:  None. FINDINGS: CT HEAD FINDINGS Brain: No evidence of acute infarction, hemorrhage, extra-axial collection, ventriculomegaly, or mass effect. Generalized cerebral atrophy. Periventricular white matter low attenuation likely secondary to microangiopathy. Vascular: Cerebrovascular atherosclerotic calcifications are noted. Skull: Negative for fracture or focal lesion. Sinuses/Orbits: Visualized portions of the orbits are unremarkable. Visualized portions of the paranasal sinuses and mastoid air cells are unremarkable. Other: None. CT CERVICAL SPINE FINDINGS Alignment: Normal. Skull base and vertebrae: No acute fracture. No primary bone lesion or focal pathologic process. Soft tissues and spinal canal: No prevertebral fluid or swelling. No visible canal hematoma. Disc levels: Degenerative disc disease with disc height loss at C6-7 and C7-T1. Severe left facet arthropathy at C2-3. Mild right facet arthropathy at C2-3. Osseous fusion of the posterior elements at C3-4 and C4-5. Moderate bilateral facet arthropathy at C5-6 and C6-7. Upper chest: The lung apices are clear. Other: No fluid collection or hematoma. Bilateral carotid artery atherosclerosis. IMPRESSION: 1. No acute intracranial pathology. 2. No acute osseous injury of the cervical spine. Electronically Signed   By: Elige Ko   On: 03/11/2017 13:00   Ct Cervical Spine Wo Contrast  Result Date: 03/11/2017 CLINICAL DATA:  Unwitnessed fall.  Laceration above the right eye. EXAM: CT HEAD WITHOUT  CONTRAST CT CERVICAL SPINE WITHOUT CONTRAST TECHNIQUE: Multidetector CT imaging of the head and cervical spine was performed following the standard protocol without intravenous contrast. Multiplanar CT image reconstructions of the cervical spine were also generated. COMPARISON:  None. FINDINGS: CT HEAD FINDINGS Brain: No evidence of acute infarction, hemorrhage, extra-axial collection, ventriculomegaly, or mass effect. Generalized cerebral atrophy. Periventricular white matter low attenuation likely secondary to microangiopathy. Vascular: Cerebrovascular atherosclerotic calcifications are noted. Skull: Negative for fracture or focal lesion. Sinuses/Orbits: Visualized portions of the orbits are unremarkable. Visualized portions of the paranasal sinuses and mastoid air cells are unremarkable. Other: None. CT CERVICAL SPINE FINDINGS Alignment: Normal. Skull base and vertebrae: No acute fracture. No primary bone lesion or focal pathologic process. Soft tissues and spinal canal: No prevertebral fluid or swelling. No visible canal hematoma. Disc levels: Degenerative disc disease with disc height loss at C6-7 and C7-T1. Severe left facet arthropathy at C2-3. Mild right facet arthropathy at C2-3. Osseous fusion of the posterior elements at C3-4 and C4-5. Moderate bilateral facet arthropathy at C5-6 and C6-7. Upper chest: The lung apices are clear. Other: No fluid collection or hematoma. Bilateral carotid artery atherosclerosis. IMPRESSION: 1. No acute intracranial pathology. 2. No acute osseous injury  of the cervical spine. Electronically Signed   By: Elige Ko   On: 03/11/2017 13:00       Subjective: Confused, pleasant  Discharge Exam: Vitals:   03/24/17 2202 03/25/17 0432  BP:  (!) 171/81  Pulse:  66  Resp:  18  Temp:  97.8 F (36.6 C)  SpO2: 99% 94%   Vitals:   03/24/17 1755 03/24/17 2115 03/24/17 2202 03/25/17 0432  BP: (!) 168/95 (!) 151/109  (!) 171/81  Pulse:  60  66  Resp:  18  18  Temp:   98.2 F (36.8 C)  97.8 F (36.6 C)  TempSrc:  Axillary  Oral  SpO2:  92% 99% 94%  Weight:      Height:        General: Pt is alert, awake, not in acute distress Cardiovascular: RRR, S1/S2 +, no rubs, no gallops Respiratory: CTA bilaterally, no wheezing, no rhonchi Abdominal: Soft, NT, ND, bowel sounds + Extremities: no edema, no cyanosis    The results of significant diagnostics from this hospitalization (including imaging, microbiology, ancillary and laboratory) are listed below for reference.     Microbiology: Recent Results (from the past 240 hour(s))  MRSA PCR Screening     Status: None   Collection Time: 03/24/17  6:04 PM  Result Value Ref Range Status   MRSA by PCR NEGATIVE NEGATIVE Final    Comment:        The GeneXpert MRSA Assay (FDA approved for NASAL specimens only), is one component of a comprehensive MRSA colonization surveillance program. It is not intended to diagnose MRSA infection nor to guide or monitor treatment for MRSA infections.      Labs: BNP (last 3 results) No results for input(s): BNP in the last 8760 hours. Basic Metabolic Panel:  Recent Labs Lab 03/24/17 1245 03/25/17 0747  NA 146* 146*  K 2.1* 3.1*  CL 103 109  CO2 38* 31  GLUCOSE 112* 88  BUN 23* 18  CREATININE 0.79 0.58  CALCIUM 8.4* 8.2*  MG 2.2  --    Liver Function Tests: No results for input(s): AST, ALT, ALKPHOS, BILITOT, PROT, ALBUMIN in the last 168 hours. No results for input(s): LIPASE, AMYLASE in the last 168 hours. No results for input(s): AMMONIA in the last 168 hours. CBC:  Recent Labs Lab 03/24/17 1245 03/25/17 0747  WBC 4.6 4.0  NEUTROABS 2.9  --   HGB 12.0 10.9*  HCT 37.1 33.1*  MCV 94.6 93.5  PLT 153 145*   Cardiac Enzymes: No results for input(s): CKTOTAL, CKMB, CKMBINDEX, TROPONINI in the last 168 hours. BNP: Invalid input(s): POCBNP CBG: No results for input(s): GLUCAP in the last 168 hours. D-Dimer No results for input(s): DDIMER in  the last 72 hours. Hgb A1c No results for input(s): HGBA1C in the last 72 hours. Lipid Profile No results for input(s): CHOL, HDL, LDLCALC, TRIG, CHOLHDL, LDLDIRECT in the last 72 hours. Thyroid function studies No results for input(s): TSH, T4TOTAL, T3FREE, THYROIDAB in the last 72 hours.  Invalid input(s): FREET3 Anemia work up No results for input(s): VITAMINB12, FOLATE, FERRITIN, TIBC, IRON, RETICCTPCT in the last 72 hours. Urinalysis    Component Value Date/Time   COLORURINE YELLOW 03/24/2017 1245   APPEARANCEUR CLEAR 03/24/2017 1245   LABSPEC 1.023 03/24/2017 1245   PHURINE 5.0 03/24/2017 1245   GLUCOSEU NEGATIVE 03/24/2017 1245   HGBUR NEGATIVE 03/24/2017 1245   BILIRUBINUR NEGATIVE 03/24/2017 1245   KETONESUR NEGATIVE 03/24/2017 1245   PROTEINUR 30 (A)  03/24/2017 1245   UROBILINOGEN 2.0 (H) 04/02/2015 1945   NITRITE NEGATIVE 03/24/2017 1245   LEUKOCYTESUR NEGATIVE 03/24/2017 1245   Sepsis Labs Invalid input(s): PROCALCITONIN,  WBC,  LACTICIDVEN Microbiology Recent Results (from the past 240 hour(s))  MRSA PCR Screening     Status: None   Collection Time: 03/24/17  6:04 PM  Result Value Ref Range Status   MRSA by PCR NEGATIVE NEGATIVE Final    Comment:        The GeneXpert MRSA Assay (FDA approved for NASAL specimens only), is one component of a comprehensive MRSA colonization surveillance program. It is not intended to diagnose MRSA infection nor to guide or monitor treatment for MRSA infections.      Time coordinating discharge: Over 30 minutes  SIGNED:   Erick Blinks, MD  [facility] 03/25/2017, 12:17 PM Pager   If 7PM-7AM, please contact night-coverage [email or web address] Password [email or web address]

## 2017-03-25 NOTE — Progress Notes (Signed)
Patient admitted from [facility]) Observation status and less that 24 hours, thus no assessment or FL2 required.  Discussed return with Tammy who is in agreement. Faxed DC summary for review. Patient will transport by EMS.  Deretha Emory, MSW Clinical Social Work: Optician, dispensing Coverage for :  [phone]

## 2017-03-25 NOTE — Care Management Note (Signed)
Case Management Note  Patient Details  Name: Joan Mathis MRN: [id number] Date of Birth: March 03, 1926          Admitted with hypokalemia. From SNF, returning today. CSW to make arrangements for return to facility.   Expected Discharge Date:  03/25/17               Expected Discharge Plan:  Skilled Nursing Facility  In-House Referral:  Clinical Social Work  Discharge planning Services  CM Consult  Post Acute Care Choice:  NA Choice offered to:  NA  Status of Service:  Completed, signed off   Malcolm Metro, RN 03/25/2017, 1:33 PM

## 2017-04-03 ENCOUNTER — Encounter (HOSPITAL_COMMUNITY): Payer: Self-pay

## 2017-04-03 ENCOUNTER — Emergency Department (HOSPITAL_COMMUNITY): Payer: Medicare Other

## 2017-04-03 ENCOUNTER — Emergency Department (HOSPITAL_COMMUNITY)
Admission: EM | Admit: 2017-04-03 | Discharge: 2017-04-04 | Disposition: A | Discharge status: Skilled Nursing Facility | Payer: Medicare Other | Attending: Emergency Medicine | Admitting: Emergency Medicine

## 2017-04-03 DIAGNOSIS — I119 Hypertensive heart disease without heart failure: Secondary | ICD-10-CM | POA: Diagnosis not present

## 2017-04-03 DIAGNOSIS — Y998 Other external cause status: Secondary | ICD-10-CM | POA: Diagnosis not present

## 2017-04-03 DIAGNOSIS — Y9389 Activity, other specified: Secondary | ICD-10-CM | POA: Insufficient documentation

## 2017-04-03 DIAGNOSIS — I2510 Atherosclerotic heart disease of native coronary artery without angina pectoris: Secondary | ICD-10-CM | POA: Diagnosis not present

## 2017-04-03 DIAGNOSIS — Z79899 Other long term (current) drug therapy: Secondary | ICD-10-CM | POA: Insufficient documentation

## 2017-04-03 DIAGNOSIS — S80211A Abrasion, right knee, initial encounter: Secondary | ICD-10-CM | POA: Diagnosis not present

## 2017-04-03 DIAGNOSIS — F0390 Unspecified dementia without behavioral disturbance: Secondary | ICD-10-CM | POA: Insufficient documentation

## 2017-04-03 DIAGNOSIS — Y92129 Unspecified place in nursing home as the place of occurrence of the external cause: Secondary | ICD-10-CM | POA: Insufficient documentation

## 2017-04-03 DIAGNOSIS — Z8673 Personal history of transient ischemic attack (TIA), and cerebral infarction without residual deficits: Secondary | ICD-10-CM | POA: Insufficient documentation

## 2017-04-03 DIAGNOSIS — W01198A Fall on same level from slipping, tripping and stumbling with subsequent striking against other object, initial encounter: Secondary | ICD-10-CM | POA: Diagnosis not present

## 2017-04-03 DIAGNOSIS — S0990XA Unspecified injury of head, initial encounter: Secondary | ICD-10-CM | POA: Diagnosis not present

## 2017-04-03 DIAGNOSIS — M791 Myalgia: Secondary | ICD-10-CM | POA: Diagnosis not present

## 2017-04-03 DIAGNOSIS — E875 Hyperkalemia: Secondary | ICD-10-CM

## 2017-04-03 DIAGNOSIS — Z7982 Long term (current) use of aspirin: Secondary | ICD-10-CM | POA: Diagnosis not present

## 2017-04-03 DIAGNOSIS — W19XXXA Unspecified fall, initial encounter: Secondary | ICD-10-CM

## 2017-04-03 NOTE — ED Triage Notes (Signed)
Pt is resident of [facility], here via [facility] after a fall where she hit her right scalp on a bp machine.  Pt has a small puncture wound/lac to right anterior scalp, bleeding controlled.

## 2017-04-03 NOTE — ED Provider Notes (Signed)
[facility]-EMERGENCY DEPT Provider Note   CSN: [id number] Arrival date & time: 04/03/17  2251     History   Chief Complaint Chief Complaint  Patient presents with  . Fall    scalp laceration    HPI Joan Mathis is a 83 y.o. female.  Level 5 caveat for dementia. Patient unable to give a history. She presents from nursing facility with head injury after a fall. Reportedly struck her head on a blood pressure machine. Unknown loss of consciousness. She has 2 small lacerations and abrasions to her scalp. She does not take blood thinners. There is also an abrasion to her right knee. She is unable to give any history. She states she hurts all over. She is oriented 1.   The history is provided by the patient and the EMS personnel. The history is limited by the condition of the patient.  Fall     Past Medical History:  Diagnosis Date  . Acid reflux   . Arthritis   . Atherosclerotic cardiovascular disease   . Atrial fibrillation (HCC)   . Blindness    legal blindness,left eye,histor  . Dementia   . Dysphagia   . FH: dementia   . H/O: stroke    ocular stroke,   . Hypercholesteremia   . Hyperlipemia   . Hypertension   . Postmenopausal   . Right kidney mass 02/14/2016   Consistent with a renal cyst per ultrasound   . Seizures (HCC)   . Stroke [facility](HCC)    tia per nursing facilities  . Trochanteric bursitis     Patient Active Problem List   Diagnosis Date Noted  . Generalized weakness 03/24/2017  . Fecal impaction (HCC) 02/14/2016  . Urinary tract infection, site not specified 02/14/2016  . Hypokalemia 02/14/2016  . Anemia due to other cause 02/14/2016  . Chronic atrial fibrillation (HCC) 02/14/2016  . Thrombocytopenia (HCC) 02/14/2016  . Seizure disorder (HCC) 02/14/2016  . Right kidney mass 02/14/2016  . Dehydration   . Sigmoid volvulus (HCC) 02/12/2016  . Hypotension 02/12/2016  . Abdominal pain 02/12/2016  . Altered mental status 02/12/2016  . Hyperlipidemia 04/03/2015    . Dementia without behavioral disturbance 04/03/2015  . Dementia with behavioral disturbance   . Atrial fibrillation with RVR (HCC)   . TIA (transient ischemic attack) 04/02/2015  . HYPERLIPIDEMIA-MIXED 05/10/2010  . Essential hypertension, benign 05/10/2010  . CAD, NATIVE VESSEL 05/10/2010    History reviewed. No pertinent surgical history.  OB History    No data available       Home Medications    Prior to Admission medications   Medication Sig Start Date End Date Taking? Authorizing Provider  acetaminophen (TYLENOL) 325 MG tablet Take 650 mg by mouth every 6 (six) hours as needed.    [provider]  Amino Acids-Protein Hydrolys (FEEDING SUPPLEMENT, PRO-STAT SUGAR FREE 64,) LIQD Take 30 mLs by mouth 2 (two) times daily. 02/15/16   Elliot CousinFisher, Denise, MD  amLODipine (NORVASC) 5 MG tablet Take 1 tablet (5 mg total) by mouth daily. 03/26/17   Erick BlinksMemon, Jehanzeb, MD  aspirin 81 MG EC tablet Take 1 tablet (81 mg total) by mouth at bedtime. Swallow whole. 02/15/16   Elliot CousinFisher, Denise, MD  clonazePAM (KLONOPIN) 0.5 MG tablet Take 0.25 mg by mouth 2 (two) times daily.    [provider]  cyanocobalamin 2000 MCG tablet Take 2,000 mcg by mouth daily.    [provider]  Eslicarbazepine Acetate 400 MG TABS Take 1 tablet by mouth daily.  [provider]  levETIRAcetam (KEPPRA) 500 MG tablet Take 500 mg by mouth 2 (two) times daily.    [provider]  Magnesium Oxide 200 MG TABS Take 1 tablet (200 mg total) by mouth daily. 02/15/16   Elliot Cousin, MD  metoprolol tartrate (LOPRESSOR) 25 MG tablet Take 25 mg by mouth daily.     [provider]  mirtazapine (REMERON) 7.5 MG tablet Take 7.5 mg by mouth at bedtime.    [provider]  omeprazole (PRILOSEC) 20 MG capsule Take 20 mg by mouth daily.    [provider]  polyethylene glycol (MIRALAX / GLYCOLAX) packet Take 17 g by mouth 2 (two) times daily. 02/15/16   Elliot Cousin, MD   potassium chloride SA (K-DUR,KLOR-CON) 20 MEQ tablet Take 20 mEq by mouth 3 (three) times daily.    [provider]  sertraline (ZOLOFT) 25 MG tablet Take 25 mg by mouth daily.    [provider]    Family History No family history on file.  Social History Social History  Substance Use Topics  . Smoking status: Never Smoker  . Smokeless tobacco: Never Used  . Alcohol use No     Allergies   Patient has no known allergies.   Review of Systems Review of Systems  Unable to perform ROS: Dementia     Physical Exam Updated Vital Signs BP 133/88   Pulse (!) 40   Temp 98.1 F (36.7 C) (Oral)   Resp 19   SpO2 (!) 85%   Physical Exam  Constitutional: She appears well-developed and well-nourished. No distress.  HENT:  Head: Normocephalic and atraumatic.  Mouth/Throat: Oropharynx is clear and moist. No oropharyngeal exudate.  Abrasion to occipital scalp, bleeding controlled Abrasion to rightforehead and hairline, bleeding controlled Diffuse paraspinal C-spine pain  Eyes: Pupils are equal, round, and reactive to light. Conjunctivae and EOM are normal.  Surgical changes of left eye, nonreactive, right eye 3 mm and reactive  Neck: Normal range of motion. Neck supple.  No meningismus.  Cardiovascular: Normal rate, regular rhythm, normal heart sounds and intact distal pulses.  Exam reveals no gallop.   No murmur heard. Pulmonary/Chest: Effort normal and breath sounds normal. No respiratory distress. She exhibits no tenderness.  Abdominal: Soft. There is no tenderness. There is no rebound and no guarding.  Musculoskeletal: Normal range of motion. She exhibits no edema or tenderness.  Abrasion to right knee, full range of motion without bony tenderness Pelvis is stable  Neurological: She is alert. No cranial nerve deficit. She exhibits normal muscle tone. Coordination normal.  Moving all extremities equally.  Disoriented. Does not answer questions meaningfully.   Skin: Skin is warm. Capillary refill takes less than 2 seconds.  Psychiatric: She has a normal mood and affect. Her behavior is normal.  Nursing note and vitals reviewed.    ED Treatments / Results  Labs (all labs ordered are listed, but only abnormal results are displayed) Labs Reviewed  CBC WITH DIFFERENTIAL/PLATELET - Abnormal; Notable for the following:       Result Value   RBC 3.78 (*)    Hemoglobin 11.4 (*)    HCT 34.5 (*)    Platelets 128 (*)    All other components within normal limits  BASIC METABOLIC PANEL - Abnormal; Notable for the following:    Potassium 5.3 (*)    BUN 30 (*)    All other components within normal limits  POTASSIUM - Abnormal; Notable for the following:  Potassium 5.3 (*)    All other components within normal limits    EKG  EKG Interpretation  Date/Time:  Friday April 04 2017 00:14:57 EDT Ventricular Rate:  89 PR Interval:    QRS Duration: 104 QT Interval:  366 QTC Calculation: 446 R Axis:   -35 Text Interpretation:  Sinus rhythm Left axis deviation Abnormal R-wave progression, early transition No significant change was found Confirmed by Glynn Octave [phone]) on 04/04/2017 1:06:23 AM       Radiology Dg Chest 1 View  Result Date: 04/04/2017 CLINICAL DATA:  Fall. EXAM: CHEST 1 VIEW COMPARISON:  02/12/2016 FINDINGS: Patient is rotated to the left. Heart size is upper normal. Aortic tortuosity atherosclerosis, accentuated by patient rotation. Low lung volumes persist with mild bibasilar atelectasis. No confluent consolidation. No pleural fluid or pneumothorax. No acute osseous abnormalities are visualized. The bones appear under mineralized. IMPRESSION: Rotated exam with low lung volumes. No evidence of acute abnormality. Electronically Signed   By: Rubye Oaks M.D.   On: 04/04/2017 00:37   Ct Head Wo Contrast  Result Date: 04/04/2017 CLINICAL DATA:  Head trauma, minor, GCS>=13, low clinical risk, initial exam. Fall striking right  side of head. EXAM: CT HEAD WITHOUT CONTRAST CT CERVICAL SPINE WITHOUT CONTRAST TECHNIQUE: Multidetector CT imaging of the head and cervical spine was performed following the standard protocol without intravenous contrast. Multiplanar CT image reconstructions of the cervical spine were also generated. COMPARISON:  Head and cervical spine CT 03/11/2017 FINDINGS: CT HEAD FINDINGS Brain: Stable atrophy and chronic small vessel ischemia. No intracranial hemorrhage, mass effect, or midline shift. No hydrocephalus. The basilar cisterns are patent. No evidence of territorial infarct or acute ischemia. No extra-axial or intracranial fluid collection. Vascular: Atherosclerosis of skullbase vasculature without hyperdense vessel or abnormal calcification. Skull: No skull fracture. Right frontal and midline parietal scalp hematoma. Sinuses/Orbits: No acute finding.  Post right cataract resection. Other: None. CT CERVICAL SPINE FINDINGS Alignment: Stable. Exaggerated cervical lordosis. No traumatic subluxation. Skull base and vertebrae: No acute fracture. Advanced C1-C2 arthropathy is stable in appearance. Vertebral body heights are maintained. The dens is intact. Skullbase is intact. Soft tissues and spinal canal: No prevertebral fluid or swelling. No visible canal hematoma. Disc levels: Stable multilevel disc space narrowing and endplate spurring. Stable multilevel facet arthropathy with bony ankylosis. Upper chest: No acute finding. Subcutaneous nodule in the left posterior soft tissue is likely a sebaceous cyst. Other: Carotid calcifications. IMPRESSION: 1. Scalp hematoma without skull fracture or acute intracranial abnormality. 2. Stable degenerative change in the cervical spine without acute fracture or subluxation. 3. Carotid and skullbase atherosclerosis. Electronically Signed   By: Rubye Oaks M.D.   On: 04/04/2017 00:17   Ct Cervical Spine Wo Contrast  Result Date: 04/04/2017 CLINICAL DATA:  Head trauma, minor,  GCS>=13, low clinical risk, initial exam. Fall striking right side of head. EXAM: CT HEAD WITHOUT CONTRAST CT CERVICAL SPINE WITHOUT CONTRAST TECHNIQUE: Multidetector CT imaging of the head and cervical spine was performed following the standard protocol without intravenous contrast. Multiplanar CT image reconstructions of the cervical spine were also generated. COMPARISON:  Head and cervical spine CT 03/11/2017 FINDINGS: CT HEAD FINDINGS Brain: Stable atrophy and chronic small vessel ischemia. No intracranial hemorrhage, mass effect, or midline shift. No hydrocephalus. The basilar cisterns are patent. No evidence of territorial infarct or acute ischemia. No extra-axial or intracranial fluid collection. Vascular: Atherosclerosis of skullbase vasculature without hyperdense vessel or abnormal calcification. Skull: No skull fracture. Right frontal and midline parietal  scalp hematoma. Sinuses/Orbits: No acute finding.  Post right cataract resection. Other: None. CT CERVICAL SPINE FINDINGS Alignment: Stable. Exaggerated cervical lordosis. No traumatic subluxation. Skull base and vertebrae: No acute fracture. Advanced C1-C2 arthropathy is stable in appearance. Vertebral body heights are maintained. The dens is intact. Skullbase is intact. Soft tissues and spinal canal: No prevertebral fluid or swelling. No visible canal hematoma. Disc levels: Stable multilevel disc space narrowing and endplate spurring. Stable multilevel facet arthropathy with bony ankylosis. Upper chest: No acute finding. Subcutaneous nodule in the left posterior soft tissue is likely a sebaceous cyst. Other: Carotid calcifications. IMPRESSION: 1. Scalp hematoma without skull fracture or acute intracranial abnormality. 2. Stable degenerative change in the cervical spine without acute fracture or subluxation. 3. Carotid and skullbase atherosclerosis. Electronically Signed   By: Rubye Oaks M.D.   On: 04/04/2017 00:17   Dg Knee Complete 4 Views  Right  Result Date: 04/04/2017 CLINICAL DATA:  Right knee pain after fall. EXAM: RIGHT KNEE - COMPLETE 4+ VIEW COMPARISON:  None. FINDINGS: No evidence of fracture, dislocation, or joint effusion. Normal for age mild osteoarthritis. Soft tissues are unremarkable. IMPRESSION: No fracture or subluxation of the right knee. Electronically Signed   By: Rubye Oaks M.D.   On: 04/04/2017 00:42    Procedures Procedures (including critical care time)  Medications Ordered in ED Medications - No data to display   Initial Impression / Assessment and Plan / ED Course  I have reviewed the triage vital signs and the nursing notes.  Pertinent labs & imaging results that were available during my care of the patient were reviewed by me and considered in my medical decision making (see chart for details).     Patient with history a-fib, CAD, HLD, HTN, and h/o CVA from facility with unwitnessed fall.  At baseline mental status by report.   CT obtained given her dementia and inability to give a history,. Wounds cleaned, tetanus up to date.  CT negative for acute pathology.  Labs obtained given her history of hypokalemia. She now has mild hyperkalemia without EKG changes. Normal renal function.  Dose of lasix given. D/c potassium supplements.  Stable for discharge back to facility. Will need recheck of potassium next week.    BP (!) 126/101   Pulse 94   Temp 98.1 F (36.7 C) (Oral)   Resp 14   SpO2 94%   Final Clinical Impressions(s) / ED Diagnoses   Final diagnoses:  Fall, initial encounter  Injury of head, initial encounter  Hyperkalemia    New Prescriptions New Prescriptions   No medications on file     Glynn Octave, MD 04/04/17 [phone]

## 2017-04-04 LAB — CBC WITH DIFFERENTIAL/PLATELET
Basophils Absolute: 0 10*3/uL (ref 0.0–0.1)
Basophils Relative: 1 %
Eosinophils Absolute: 0.2 10*3/uL (ref 0.0–0.7)
Eosinophils Relative: 3 %
HEMATOCRIT: 34.5 % — AB (ref 36.0–46.0)
HEMOGLOBIN: 11.4 g/dL — AB (ref 12.0–15.0)
LYMPHS ABS: 1.6 10*3/uL (ref 0.7–4.0)
LYMPHS PCT: 29 %
MCH: 30.2 pg (ref 26.0–34.0)
MCHC: 33 g/dL (ref 30.0–36.0)
MCV: 91.3 fL (ref 78.0–100.0)
MONO ABS: 0.4 10*3/uL (ref 0.1–1.0)
MONOS PCT: 6 %
NEUTROS ABS: 3.3 10*3/uL (ref 1.7–7.7)
NEUTROS PCT: 61 %
Platelets: 128 10*3/uL — ABNORMAL LOW (ref 150–400)
RBC: 3.78 MIL/uL — ABNORMAL LOW (ref 3.87–5.11)
RDW: 14.5 % (ref 11.5–15.5)
WBC: 5.5 10*3/uL (ref 4.0–10.5)

## 2017-04-04 LAB — BASIC METABOLIC PANEL
Anion gap: 6 (ref 5–15)
BUN: 30 mg/dL — ABNORMAL HIGH (ref 6–20)
CHLORIDE: 102 mmol/L (ref 101–111)
CO2: 27 mmol/L (ref 22–32)
CREATININE: 0.75 mg/dL (ref 0.44–1.00)
Calcium: 9.1 mg/dL (ref 8.9–10.3)
GFR calc non Af Amer: >60 mL/min (ref >60)
GLUCOSE: 92 mg/dL (ref 65–99)
Potassium: 5.3 mmol/L — ABNORMAL HIGH (ref 3.5–5.1)
Sodium: 135 mmol/L (ref 135–145)

## 2017-04-04 LAB — POTASSIUM: POTASSIUM: 5.3 mmol/L — AB (ref 3.5–5.1)

## 2017-04-04 MED ORDER — FUROSEMIDE 40 MG PO TABS
40.0000 mg | ORAL_TABLET | Freq: Once | ORAL | Status: AC
  Administered 2017-04-04: 40 mg via ORAL

## 2017-04-04 MED ORDER — SODIUM POLYSTYRENE SULFONATE 15 GM/60ML PO SUSP
30.0000 g | Freq: Once | ORAL | Status: AC
  Administered 2017-04-04: 30 g via ORAL
  Filled 2017-04-04: qty 120

## 2017-04-04 MED ORDER — FUROSEMIDE 40 MG PO TABS
ORAL_TABLET | ORAL | Status: AC
  Filled 2017-04-04: qty 1

## 2017-04-04 MED ORDER — POTASSIUM CHLORIDE CRYS ER 20 MEQ PO TBCR: 40.0000 meq | EXTENDED_RELEASE_TABLET | Freq: Once | ORAL | Status: DC

## 2017-04-04 NOTE — ED Notes (Signed)
Gave EKG to Dr.Rancour 

## 2017-04-04 NOTE — Discharge Instructions (Signed)
CT of the head is normal. Potassium is high. Stop taking potassium supplements and have potassium rechecked in 2 days. Return to the ED with new or worsening symptoms.

## 2017-05-02 ENCOUNTER — Emergency Department (HOSPITAL_COMMUNITY): Payer: Medicare Other

## 2017-05-02 ENCOUNTER — Emergency Department (HOSPITAL_COMMUNITY)
Admission: EM | Admit: 2017-05-02 | Discharge: 2017-05-02 | Disposition: A | Discharge status: Other Acute Inpatient Hospital | Payer: Medicare Other | Attending: Emergency Medicine | Admitting: Emergency Medicine

## 2017-05-02 ENCOUNTER — Encounter (HOSPITAL_COMMUNITY): Payer: Self-pay

## 2017-05-02 DIAGNOSIS — Y92129 Unspecified place in nursing home as the place of occurrence of the external cause: Secondary | ICD-10-CM

## 2017-05-02 DIAGNOSIS — Z7982 Long term (current) use of aspirin: Secondary | ICD-10-CM | POA: Diagnosis not present

## 2017-05-02 DIAGNOSIS — Y998 Other external cause status: Secondary | ICD-10-CM | POA: Diagnosis not present

## 2017-05-02 DIAGNOSIS — W06XXXA Fall from bed, initial encounter: Secondary | ICD-10-CM | POA: Diagnosis not present

## 2017-05-02 DIAGNOSIS — Y92122 Bedroom in nursing home as the place of occurrence of the external cause: Secondary | ICD-10-CM | POA: Diagnosis not present

## 2017-05-02 DIAGNOSIS — Z79899 Other long term (current) drug therapy: Secondary | ICD-10-CM | POA: Insufficient documentation

## 2017-05-02 DIAGNOSIS — Z96642 Presence of left artificial hip joint: Secondary | ICD-10-CM | POA: Insufficient documentation

## 2017-05-02 DIAGNOSIS — I10 Essential (primary) hypertension: Secondary | ICD-10-CM | POA: Insufficient documentation

## 2017-05-02 DIAGNOSIS — W19XXXA Unspecified fall, initial encounter: Secondary | ICD-10-CM

## 2017-05-02 DIAGNOSIS — F0390 Unspecified dementia without behavioral disturbance: Secondary | ICD-10-CM | POA: Diagnosis not present

## 2017-05-02 DIAGNOSIS — Y9389 Activity, other specified: Secondary | ICD-10-CM | POA: Diagnosis not present

## 2017-05-02 DIAGNOSIS — I2510 Atherosclerotic heart disease of native coronary artery without angina pectoris: Secondary | ICD-10-CM | POA: Diagnosis not present

## 2017-05-02 DIAGNOSIS — R4182 Altered mental status, unspecified: Secondary | ICD-10-CM | POA: Diagnosis present

## 2017-05-02 NOTE — Discharge Instructions (Signed)
Pt did not appear to have any injury.  Recheck for any problems on the head injury sheet or if you suspect some other injury.

## 2017-05-02 NOTE — ED Provider Notes (Signed)
[facility]Y DEPT Provider Note   CSN: [id number] Arrival date & time: 05/02/17  0303  Time seen 03:15 AM   History   Chief Complaint Chief Complaint  Patient presents with  . Fall   Level V caveat for dementia  HPI Joan Mathis is a 83 y.o. female.  HPI  patient presents to the emergency department via EMS after falling at her nursing facility. They state patient had gotten out of bed and was trying to walk to her closet when she fell. They did not report any known injury. Patient is sleeping soundly, when I wake her up she denies any head or neck pain. She states she feels fine.  PCP Bernerd Limbo, MD   Past Medical History:  Diagnosis Date  . Acid reflux   . Arthritis   . Atherosclerotic cardiovascular disease   . Atrial fibrillation (HCC)   . Blindness    legal blindness,left eye,histor  . Dementia   . Dysphagia   . FH: dementia   . H/O: stroke    ocular stroke,   . Hypercholesteremia   . Hyperlipemia   . Hypertension   . Postmenopausal   . Right kidney mass 02/14/2016   Consistent with a renal cyst per ultrasound   . Seizures (HCC)   . Stroke [facility])    tia per nursing facilities  . Trochanteric bursitis     Patient Active Problem List   Diagnosis Date Noted  . Generalized weakness 03/24/2017  . Fecal impaction (HCC) 02/14/2016  . Urinary tract infection, site not specified 02/14/2016  . Hypokalemia 02/14/2016  . Anemia due to other cause 02/14/2016  . Chronic atrial fibrillation (HCC) 02/14/2016  . Thrombocytopenia (HCC) 02/14/2016  . Seizure disorder (HCC) 02/14/2016  . Right kidney mass 02/14/2016  . Dehydration   . Sigmoid volvulus (HCC) 02/12/2016  . Hypotension 02/12/2016  . Abdominal pain 02/12/2016  . Altered mental status 02/12/2016  . Hyperlipidemia 04/03/2015  . Dementia without behavioral disturbance 04/03/2015  . Dementia with behavioral disturbance   . Atrial fibrillation with RVR (HCC)   . TIA (transient ischemic  attack) 04/02/2015  . HYPERLIPIDEMIA-MIXED 05/10/2010  . Essential hypertension, benign 05/10/2010  . CAD, NATIVE VESSEL 05/10/2010    History reviewed. No pertinent surgical history.  OB History    No data available       Home Medications    Prior to Admission medications   Medication Sig Start Date End Date Taking? Authorizing Provider  acetaminophen (TYLENOL) 325 MG tablet Take 650 mg by mouth every 6 (six) hours as needed.    [provider]  Amino Acids-Protein Hydrolys (FEEDING SUPPLEMENT, PRO-STAT SUGAR FREE 64,) LIQD Take 30 mLs by mouth 2 (two) times daily. 02/15/16   Elliot Cousin, MD  amLODipine (NORVASC) 5 MG tablet Take 1 tablet (5 mg total) by mouth daily. 03/26/17   Erick Blinks, MD  aspirin 81 MG EC tablet Take 1 tablet (81 mg total) by mouth at bedtime. Swallow whole. 02/15/16   Elliot Cousin, MD  clonazePAM (KLONOPIN) 0.5 MG tablet Take 0.25 mg by mouth 2 (two) times daily.    [provider]  cyanocobalamin 2000 MCG tablet Take 2,000 mcg by mouth daily.    [provider]  Eslicarbazepine Acetate 400 MG TABS Take 1 tablet by mouth daily.    [provider]  levETIRAcetam (KEPPRA) 500 MG tablet Take 500 mg by mouth 2 (two) times daily.    [provider]  Magnesium Oxide 200 MG TABS  Take 1 tablet (200 mg total) by mouth daily. 02/15/16   Elliot Cousin, MD  metoprolol tartrate (LOPRESSOR) 25 MG tablet Take 25 mg by mouth daily.     [provider]  mirtazapine (REMERON) 7.5 MG tablet Take 7.5 mg by mouth at bedtime.    [provider]  omeprazole (PRILOSEC) 20 MG capsule Take 20 mg by mouth daily.    [provider]  polyethylene glycol (MIRALAX / GLYCOLAX) packet Take 17 g by mouth 2 (two) times daily. 02/15/16   Elliot Cousin, MD  sertraline (ZOLOFT) 25 MG tablet Take 25 mg by mouth daily.    [provider]    Family History History reviewed. No pertinent family history.  Social  History Social History  Substance Use Topics  . Smoking status: Never Smoker  . Smokeless tobacco: Never Used  . Alcohol use No  lives in NH   Allergies   Patient has no known allergies.   Review of Systems Review of Systems  Unable to perform ROS: Dementia     Physical Exam Updated Vital Signs BP (!) 169/92 (BP Location: Left Arm)   Pulse 60   Temp 99.7 F (37.6 C) (Rectal)   Resp 18   Wt 61.7 kg (136 lb)   SpO2 98%   BMI 21.30 kg/m   Vital signs normal except hypertension   Physical Exam  Constitutional:  Non-toxic appearance. She does not appear ill. No distress.  Frail elderly female sleeping with her mouth wide open.  HENT:  Head: Normocephalic and atraumatic.  Right Ear: External ear normal.  Left Ear: External ear normal.  Nose: Nose normal. No mucosal edema or rhinorrhea.  Mouth/Throat: Mucous membranes are dry.  Edentulous  Eyes: Pupils are equal, round, and reactive to light. Conjunctivae and EOM are normal.  Neck: Normal range of motion and full passive range of motion without pain. Neck supple.  Cardiovascular: Normal rate, regular rhythm and normal heart sounds.  Exam reveals no gallop and no friction rub.   No murmur heard. Pulmonary/Chest: Effort normal and breath sounds normal. No respiratory distress. She has no wheezes. She has no rhonchi. She has no rales. She exhibits no tenderness and no crepitus.  Abdominal: Soft. Normal appearance and bowel sounds are normal. She exhibits no distension. There is no tenderness. There is no rebound and no guarding.  Musculoskeletal: Normal range of motion. She exhibits no edema or tenderness.  Moves all extremities well, but when I try to move her lower extremities she seems to be painful. There is no obvious deformity seen.  Neurological: She has normal strength. No cranial nerve deficit.  Sleeping but easily arousable.  Skin: Skin is warm, dry and intact. No rash noted. No erythema. No pallor.    Psychiatric: Her mood appears not anxious. She is slowed.  Patient mainly answers in few syllables.   Nursing note and vitals reviewed.    ED Treatments / Results  Labs (all labs ordered are listed, but only abnormal results are displayed) Labs Reviewed - No data to display  EKG  EKG Interpretation None       Radiology Dg Pelvis Portable  Result Date: 05/02/2017 CLINICAL DATA:  Found on floor.  Unresponsive. EXAM: PORTABLE PELVIS 1-2 VIEWS COMPARISON:  CT abdomen and pelvis February 12, 2016 FINDINGS: There is no evidence of pelvic fracture or diastasis. No pelvic bone lesions are seen. Pes post RIGHT hip total arthroplasty. LEFT femoral neck pinning with callus. Osteopenia. Multiple loops of gas and  stool distended bowel. IMPRESSION: No acute fracture deformity or dislocation. Status post RIGHT hip total arthroplasty and LEFT femoral neck pinning. Osteopenia, decreases sensitivity for acute nondisplaced fractures. Stool and gas distended bowel as seen on prior CT. Electronically Signed   By: Awilda Metro M.D.   On: 05/02/2017 03:41    Procedures Procedures (including critical care time)  Medications Ordered in ED Medications - No data to display   Initial Impression / Assessment and Plan / ED Course  I have reviewed the triage vital signs and the nursing notes.  Pertinent labs & imaging results that were available during my care of the patient were reviewed by me and considered in my medical decision making (see chart for details).     There was no obvious sign of trauma on the patient especially to her head. She denies head or neck pain. AP pelvis was done to further evaluate possible pain in her hips.CT scan of the head was not done. Patient is answering questions appropriately with her history of dementia.  Patient was discharged back to her nursing facility.  Final Clinical Impressions(s) / ED Diagnoses   Final diagnoses:  Fall at nursing home, initial encounter     Plan discharge  Devoria Albe, MD, Concha Pyo, MD 05/02/17 [phone]

## 2017-05-02 NOTE — ED Triage Notes (Signed)
Per EMS, pt got of bed and went to her closet and fell.

## 2018-01-04 ENCOUNTER — Other Ambulatory Visit: Payer: Self-pay

## 2018-01-04 ENCOUNTER — Encounter (HOSPITAL_COMMUNITY): Payer: Self-pay | Admitting: *Deleted

## 2018-01-04 ENCOUNTER — Emergency Department (HOSPITAL_COMMUNITY): Payer: Medicare Other

## 2018-01-04 ENCOUNTER — Inpatient Hospital Stay (HOSPITAL_COMMUNITY)
Admission: EM | Admit: 2018-01-04 | Discharge: 2018-01-08 | DRG: 871 | Disposition: A | Discharge status: Hospice | Payer: Medicare Other | Attending: Internal Medicine | Admitting: Internal Medicine

## 2018-01-04 DIAGNOSIS — E7800 Pure hypercholesterolemia, unspecified: Secondary | ICD-10-CM | POA: Diagnosis present

## 2018-01-04 DIAGNOSIS — Z8673 Personal history of transient ischemic attack (TIA), and cerebral infarction without residual deficits: Secondary | ICD-10-CM

## 2018-01-04 DIAGNOSIS — I358 Other nonrheumatic aortic valve disorders: Secondary | ICD-10-CM | POA: Diagnosis present

## 2018-01-04 DIAGNOSIS — E785 Hyperlipidemia, unspecified: Secondary | ICD-10-CM | POA: Diagnosis present

## 2018-01-04 DIAGNOSIS — I482 Chronic atrial fibrillation, unspecified: Secondary | ICD-10-CM | POA: Diagnosis present

## 2018-01-04 DIAGNOSIS — G9349 Other encephalopathy: Secondary | ICD-10-CM | POA: Diagnosis present

## 2018-01-04 DIAGNOSIS — E876 Hypokalemia: Secondary | ICD-10-CM | POA: Diagnosis present

## 2018-01-04 DIAGNOSIS — F0390 Unspecified dementia without behavioral disturbance: Secondary | ICD-10-CM | POA: Diagnosis present

## 2018-01-04 DIAGNOSIS — Z7401 Bed confinement status: Secondary | ICD-10-CM

## 2018-01-04 DIAGNOSIS — R509 Fever, unspecified: Secondary | ICD-10-CM

## 2018-01-04 DIAGNOSIS — A419 Sepsis, unspecified organism: Secondary | ICD-10-CM | POA: Diagnosis not present

## 2018-01-04 DIAGNOSIS — B9620 Unspecified Escherichia coli [E. coli] as the cause of diseases classified elsewhere: Secondary | ICD-10-CM | POA: Diagnosis present

## 2018-01-04 DIAGNOSIS — R1310 Dysphagia, unspecified: Secondary | ICD-10-CM

## 2018-01-04 DIAGNOSIS — R9401 Abnormal electroencephalogram [EEG]: Secondary | ICD-10-CM | POA: Diagnosis present

## 2018-01-04 DIAGNOSIS — E440 Moderate protein-calorie malnutrition: Secondary | ICD-10-CM

## 2018-01-04 DIAGNOSIS — Z6820 Body mass index (BMI) 20.0-20.9, adult: Secondary | ICD-10-CM

## 2018-01-04 DIAGNOSIS — Z66 Do not resuscitate: Secondary | ICD-10-CM | POA: Diagnosis present

## 2018-01-04 DIAGNOSIS — Z7189 Other specified counseling: Secondary | ICD-10-CM

## 2018-01-04 DIAGNOSIS — I613 Nontraumatic intracerebral hemorrhage in brain stem: Secondary | ICD-10-CM | POA: Diagnosis not present

## 2018-01-04 DIAGNOSIS — R471 Dysarthria and anarthria: Secondary | ICD-10-CM | POA: Diagnosis present

## 2018-01-04 DIAGNOSIS — R402144 Coma scale, eyes open, spontaneous, 24 hours or more after hospital admission: Secondary | ICD-10-CM | POA: Diagnosis not present

## 2018-01-04 DIAGNOSIS — I6389 Other cerebral infarction: Secondary | ICD-10-CM | POA: Diagnosis present

## 2018-01-04 DIAGNOSIS — N390 Urinary tract infection, site not specified: Secondary | ICD-10-CM | POA: Diagnosis present

## 2018-01-04 DIAGNOSIS — R4182 Altered mental status, unspecified: Secondary | ICD-10-CM | POA: Diagnosis present

## 2018-01-04 DIAGNOSIS — G9340 Encephalopathy, unspecified: Secondary | ICD-10-CM | POA: Diagnosis present

## 2018-01-04 DIAGNOSIS — J690 Pneumonitis due to inhalation of food and vomit: Secondary | ICD-10-CM

## 2018-01-04 DIAGNOSIS — I10 Essential (primary) hypertension: Secondary | ICD-10-CM | POA: Diagnosis present

## 2018-01-04 DIAGNOSIS — Z79899 Other long term (current) drug therapy: Secondary | ICD-10-CM

## 2018-01-04 DIAGNOSIS — Z515 Encounter for palliative care: Secondary | ICD-10-CM

## 2018-01-04 DIAGNOSIS — Z7982 Long term (current) use of aspirin: Secondary | ICD-10-CM

## 2018-01-04 DIAGNOSIS — I2510 Atherosclerotic heart disease of native coronary artery without angina pectoris: Secondary | ICD-10-CM | POA: Diagnosis present

## 2018-01-04 DIAGNOSIS — R4702 Dysphasia: Secondary | ICD-10-CM | POA: Diagnosis present

## 2018-01-04 DIAGNOSIS — G40909 Epilepsy, unspecified, not intractable, without status epilepticus: Secondary | ICD-10-CM

## 2018-01-04 DIAGNOSIS — K219 Gastro-esophageal reflux disease without esophagitis: Secondary | ICD-10-CM | POA: Diagnosis present

## 2018-01-04 DIAGNOSIS — E860 Dehydration: Secondary | ICD-10-CM | POA: Diagnosis present

## 2018-01-04 DIAGNOSIS — R402224 Coma scale, best verbal response, incomprehensible words, 24 hours or more after hospital admission: Secondary | ICD-10-CM | POA: Diagnosis not present

## 2018-01-04 DIAGNOSIS — R402344 Coma scale, best motor response, flexion withdrawal, 24 hours or more after hospital admission: Secondary | ICD-10-CM | POA: Diagnosis not present

## 2018-01-04 DIAGNOSIS — H548 Legal blindness, as defined in USA: Secondary | ICD-10-CM | POA: Diagnosis present

## 2018-01-04 DIAGNOSIS — R748 Abnormal levels of other serum enzymes: Secondary | ICD-10-CM | POA: Diagnosis present

## 2018-01-04 LAB — CBC WITH DIFFERENTIAL/PLATELET
BASOS ABS: 0 10*3/uL (ref 0.0–0.1)
BASOS PCT: 0 %
EOS ABS: 0.1 10*3/uL (ref 0.0–0.7)
Eosinophils Relative: 1 %
HCT: 35.4 % — ABNORMAL LOW (ref 36.0–46.0)
HEMOGLOBIN: 11.5 g/dL — AB (ref 12.0–15.0)
Lymphocytes Relative: 24 %
Lymphs Abs: 1.3 10*3/uL (ref 0.7–4.0)
MCH: 31.2 pg (ref 26.0–34.0)
MCHC: 32.5 g/dL (ref 30.0–36.0)
MCV: 95.9 fL (ref 78.0–100.0)
Monocytes Absolute: 0.4 10*3/uL (ref 0.1–1.0)
Monocytes Relative: 8 %
NEUTROS PCT: 67 %
Neutro Abs: 3.5 10*3/uL (ref 1.7–7.7)
PLATELETS: 189 10*3/uL (ref 150–400)
RBC: 3.69 MIL/uL — ABNORMAL LOW (ref 3.87–5.11)
RDW: 12.6 % (ref 11.5–15.5)
WBC: 5.2 10*3/uL (ref 4.0–10.5)

## 2018-01-04 MED ORDER — ACETAMINOPHEN 650 MG RE SUPP
650.0000 mg | Freq: Once | RECTAL | Status: AC
Start: 2018-01-04 — End: 2018-01-04
  Administered 2018-01-04: 650 mg via RECTAL
  Filled 2018-01-04: qty 1

## 2018-01-04 MED ORDER — VANCOMYCIN HCL IN DEXTROSE 1-5 GM/200ML-% IV SOLN
1000.0000 mg | Freq: Once | INTRAVENOUS | Status: AC
  Administered 2018-01-05: 1000 mg via INTRAVENOUS
  Filled 2018-01-04: qty 200

## 2018-01-04 MED ORDER — SODIUM CHLORIDE 0.9 % IV BOLUS (SEPSIS)
1000.0000 mL | Freq: Once | INTRAVENOUS | Status: AC
  Administered 2018-01-05: 1000 mL via INTRAVENOUS

## 2018-01-04 MED ORDER — SODIUM CHLORIDE 0.9 % IV BOLUS (SEPSIS)
1000.0000 mL | Freq: Once | INTRAVENOUS | Status: AC
  Administered 2018-01-04: 1000 mL via INTRAVENOUS

## 2018-01-04 MED ORDER — PIPERACILLIN-TAZOBACTAM 3.375 G IVPB 30 MIN
3.3750 g | Freq: Once | INTRAVENOUS | Status: AC
  Administered 2018-01-04: 3.375 g via INTRAVENOUS
  Filled 2018-01-04: qty 50

## 2018-01-04 NOTE — ED Provider Notes (Signed)
[facility] EMERGENCY DEPARTMENT Provider Note   CSN: [id number] Arrival date & time: 01/04/18  2248     History   Chief Complaint Chief Complaint  Patient presents with  . Altered Mental Status    HPI Joan Mathis is a 84 y.o. female.  Level 5 caveat for altered mental status.  Patient brought in by EMS from nursing facility with change in mental status and fever.  She has been lethargic and responding only to pain all day.  She is normally alert and seems and can answer questions.  Unclear last time she was normal.  She was febrile to 102.9 and received unknown dose of Tylenol at her facility.  She has a congested cough.  Patient has a history of dementia and is bedbound.  She has a history of atrial fibrillation but is not anticoagulated.  History of previous stroke.  She is DNR and DNI.  The history is provided by the patient, the nursing home and the EMS personnel. The history is limited by the condition of the patient.  Altered Mental Status      Past Medical History:  Diagnosis Date  . Acid reflux   . Arthritis   . Atherosclerotic cardiovascular disease   . Atrial fibrillation (HCC)   . Blindness    legal blindness,left eye,histor  . Dementia   . Dysphagia   . FH: dementia   . H/O: stroke    ocular stroke,   . Hypercholesteremia   . Hyperlipemia   . Hypertension   . Postmenopausal   . Right kidney mass 02/14/2016   Consistent with a renal cyst per ultrasound   . Seizures (HCC)   . Stroke [facility])    tia per nursing facilities  . Trochanteric bursitis     Patient Active Problem List   Diagnosis Date Noted  . Generalized weakness 03/24/2017  . Fecal impaction (HCC) 02/14/2016  . Urinary tract infection, site not specified 02/14/2016  . Hypokalemia 02/14/2016  . Anemia due to other cause 02/14/2016  . Chronic atrial fibrillation (HCC) 02/14/2016  . Thrombocytopenia (HCC) 02/14/2016  . Seizure disorder (HCC) 02/14/2016  . Right kidney mass 02/14/2016  .  Dehydration   . Sigmoid volvulus (HCC) 02/12/2016  . Hypotension 02/12/2016  . Abdominal pain 02/12/2016  . Altered mental status 02/12/2016  . Hyperlipidemia 04/03/2015  . Dementia without behavioral disturbance 04/03/2015  . Dementia with behavioral disturbance   . Atrial fibrillation with RVR (HCC)   . TIA (transient ischemic attack) 04/02/2015  . HYPERLIPIDEMIA-MIXED 05/10/2010  . Essential hypertension, benign 05/10/2010  . CAD, NATIVE VESSEL 05/10/2010    History reviewed. No pertinent surgical history.   OB History   None      Home Medications    Prior to Admission medications   Medication Sig Start Date End Date Taking? Authorizing Provider  Amino Acids-Protein Hydrolys (FEEDING SUPPLEMENT, PRO-STAT SUGAR FREE 64,) LIQD Take 30 mLs by mouth 2 (two) times daily. 02/15/16  Yes Elliot Cousin, MD  amLODipine (NORVASC) 5 MG tablet Take 1 tablet (5 mg total) by mouth daily. 03/26/17  Yes Erick Blinks, MD  aspirin 81 MG EC tablet Take 1 tablet (81 mg total) by mouth at bedtime. Swallow whole. Patient taking differently: Take 81 mg by mouth daily. Swallow whole. 02/15/16  Yes Elliot Cousin, MD  clonazePAM (KLONOPIN) 0.5 MG tablet Take 0.25 mg by mouth 2 (two) times daily.   Yes [provider]  cyanocobalamin 2000 MCG tablet Take 2,000 mcg by mouth daily.  Yes [provider]  Eslicarbazepine Acetate 400 MG TABS Take 1 tablet by mouth daily.   Yes [provider]  levETIRAcetam (KEPPRA) 500 MG tablet Take 500 mg by mouth 2 (two) times daily.   Yes [provider]  Magnesium Oxide 200 MG TABS Take 1 tablet (200 mg total) by mouth daily. 02/15/16  Yes Elliot CousinFisher, Denise, MD  omeprazole (PRILOSEC) 20 MG capsule Take 20 mg by mouth daily.   Yes [provider]  polyethylene glycol (MIRALAX / GLYCOLAX) packet Take 17 g by mouth 2 (two) times daily. Patient taking differently: Take 17 g by mouth daily.  02/15/16  Yes Elliot CousinFisher, Denise, MD    sertraline (ZOLOFT) 25 MG tablet Take 25 mg by mouth daily.   Yes [provider]    Family History History reviewed. No pertinent family history.  Social History Social History   Tobacco Use  . Smoking status: Never Smoker  . Smokeless tobacco: Never Used  Substance Use Topics  . Alcohol use: No  . Drug use: No     Allergies   Patient has no known allergies.   Review of Systems Review of Systems  Unable to perform ROS: Mental status change     Physical Exam Updated Vital Signs BP (!) 159/72   Pulse 60   Resp 15   SpO2 99%   Physical Exam  Constitutional: She appears distressed.   Patient is obtunded and lethargic and acutely ill-appearing Sonorous respirations  HENT:  Head: Normocephalic and atraumatic.  Mouth/Throat: Oropharynx is clear and moist. No oropharyngeal exudate.  Dry mucous membranes with minimal gag reflex  Eyes: Pupils are equal, round, and reactive to light. Conjunctivae and EOM are normal.  Neck: Normal range of motion. Neck supple.  No meningismus.  Cardiovascular: Normal rate, regular rhythm, normal heart sounds and intact distal pulses.  No murmur heard. Pulmonary/Chest: She is in respiratory distress. She has rales.  Coarse rhonchi at the bases  Abdominal: Soft. There is no tenderness. There is no rebound and no guarding.  Musculoskeletal: Normal range of motion. She exhibits no edema or tenderness.  Neurological: She is alert. No cranial nerve deficit. She exhibits normal muscle tone. Coordination normal.  Obtunded, responds to pain by moaning, does not follow any commands  Skin: Skin is warm and dry. Capillary refill takes 2 to 3 seconds.  Psychiatric: She has a normal mood and affect. Her behavior is normal.  Nursing note and vitals reviewed.    ED Treatments / Results  Labs (all labs ordered are listed, but only abnormal results are displayed) Labs Reviewed  CULTURE, BLOOD (ROUTINE X 2)  CULTURE, BLOOD (ROUTINE X 2)   URINE CULTURE  COMPREHENSIVE METABOLIC PANEL  CBC WITH DIFFERENTIAL/PLATELET  URINALYSIS, ROUTINE W REFLEX MICROSCOPIC  I-STAT CG4 LACTIC ACID, ED    EKG  EKG Interpretation  Date/Time:  Sunday January 04 2018 23:03:31 EDT Ventricular Rate:  88 PR Interval:    QRS Duration: 106 QT Interval:  359 QTC Calculation: 435 R Axis:   -168 Text Interpretation:  Sinus or ectopic atrial rhythm Multiform ventricular premature complexes Inferior infarct, old Baseline wander in lead(s) V3 Artifact intermittent A fib  Confirmed by Glynn Octaveancour, Natosha Bou [phone]([id number]) on 01/04/2018 11:33:22 PM       Radiology No results found.  Procedures Procedures (including critical care time)  Medications Ordered in ED Medications  sodium chloride 0.9 % bolus 1,000 mL (has no administration in time range)    And  sodium chloride 0.9 %  bolus 1,000 mL (has no administration in time range)  piperacillin-tazobactam (ZOSYN) IVPB 3.375 g (has no administration in time range)  vancomycin (VANCOCIN) IVPB 1000 mg/200 mL premix (has no administration in time range)  acetaminophen (TYLENOL) suppository 650 mg (has no administration in time range)     Initial Impression / Assessment and Plan / ED Course  I have reviewed the triage vital signs and the nursing notes.    Pertinent labs & imaging results that were available during my care of the patient were reviewed by me and considered in my medical decision making (see chart for details).  Patient from nursing home with acute change in mental status and fever and obtunded.  She is minimally responsive.  Code sepsis activated patient given IV fluids and IV antibiotics after cultures obtained.  Suspect aspiration pneumonia.  Discussed with patient's daughter Lakeisa Heninger on arrival.  She confirms DNR/DNI status.  Discussed gravity of situation that her mother is acutely ill and may not survive this illness.  Patient treated as sepsis with IV fluids and IV antibiotics.  Chest  x-ray does not show obvious infiltrate but concern for aspiration is high.  Hypokalemia noted and aggressively repleted.  Magnesium is normal.  Urinalysis is not impressive for infection.  Blood and urine cultures are pending.  Patient remains obtunded and requires supplemental oxygen.  Admission to ICU discussed with Dr. Robb Matar.  CRITICAL CARE Performed by: Glynn Octave Total critical care time: 40 minutes Critical care time was exclusive of separately billable procedures and treating other patients. Critical care was necessary to treat or prevent imminent or life-threatening deterioration. Critical care was time spent personally by me on the following activities: development of treatment plan with patient and/or surrogate as well as nursing, discussions with consultants, evaluation of patient's response to treatment, examination of patient, obtaining history from patient or surrogate, ordering and performing treatments and interventions, ordering and review of laboratory studies, ordering and review of radiographic studies, pulse oximetry and re-evaluation of patient's condition.   Final diagnoses:  None    ED Discharge Orders    None       Othelia Riederer, Jeannett Senior, MD 01/05/18 [id number]

## 2018-01-04 NOTE — ED Triage Notes (Signed)
Pt brought in by [facility] for c/o altered loc; staff reports to ems that pt had a temp of 102.9 earlier and pt was given tylenol suppository; pt is lethargic and only responds to painful stimuli; staff reports pt is usually alert, but not oriented due to dementia, and pt usually sings; pt has congested cough; cbg 122

## 2018-01-05 ENCOUNTER — Emergency Department (HOSPITAL_COMMUNITY): Payer: Medicare Other

## 2018-01-05 ENCOUNTER — Other Ambulatory Visit: Payer: Self-pay

## 2018-01-05 ENCOUNTER — Encounter (HOSPITAL_COMMUNITY): Payer: Self-pay | Admitting: *Deleted

## 2018-01-05 ENCOUNTER — Inpatient Hospital Stay (HOSPITAL_COMMUNITY)
Admit: 2018-01-05 | Discharge: 2018-01-05 | Disposition: A | Discharge status: Home / Self Care | Payer: Medicare Other | Attending: Internal Medicine | Admitting: Internal Medicine

## 2018-01-05 DIAGNOSIS — G9349 Other encephalopathy: Secondary | ICD-10-CM | POA: Diagnosis present

## 2018-01-05 DIAGNOSIS — E860 Dehydration: Secondary | ICD-10-CM | POA: Diagnosis present

## 2018-01-05 DIAGNOSIS — R4182 Altered mental status, unspecified: Secondary | ICD-10-CM

## 2018-01-05 DIAGNOSIS — N390 Urinary tract infection, site not specified: Secondary | ICD-10-CM | POA: Diagnosis present

## 2018-01-05 DIAGNOSIS — Z515 Encounter for palliative care: Secondary | ICD-10-CM | POA: Diagnosis not present

## 2018-01-05 DIAGNOSIS — R402344 Coma scale, best motor response, flexion withdrawal, 24 hours or more after hospital admission: Secondary | ICD-10-CM | POA: Diagnosis not present

## 2018-01-05 DIAGNOSIS — Z66 Do not resuscitate: Secondary | ICD-10-CM | POA: Diagnosis present

## 2018-01-05 DIAGNOSIS — K219 Gastro-esophageal reflux disease without esophagitis: Secondary | ICD-10-CM | POA: Diagnosis present

## 2018-01-05 DIAGNOSIS — R471 Dysarthria and anarthria: Secondary | ICD-10-CM | POA: Diagnosis present

## 2018-01-05 DIAGNOSIS — H548 Legal blindness, as defined in USA: Secondary | ICD-10-CM | POA: Diagnosis present

## 2018-01-05 DIAGNOSIS — F0280 Dementia in other diseases classified elsewhere without behavioral disturbance: Secondary | ICD-10-CM | POA: Diagnosis not present

## 2018-01-05 DIAGNOSIS — I613 Nontraumatic intracerebral hemorrhage in brain stem: Secondary | ICD-10-CM | POA: Diagnosis not present

## 2018-01-05 DIAGNOSIS — G40909 Epilepsy, unspecified, not intractable, without status epilepticus: Secondary | ICD-10-CM | POA: Diagnosis present

## 2018-01-05 DIAGNOSIS — B9620 Unspecified Escherichia coli [E. coli] as the cause of diseases classified elsewhere: Secondary | ICD-10-CM | POA: Diagnosis present

## 2018-01-05 DIAGNOSIS — I6389 Other cerebral infarction: Secondary | ICD-10-CM | POA: Diagnosis not present

## 2018-01-05 DIAGNOSIS — J690 Pneumonitis due to inhalation of food and vomit: Secondary | ICD-10-CM | POA: Diagnosis present

## 2018-01-05 DIAGNOSIS — R1310 Dysphagia, unspecified: Secondary | ICD-10-CM | POA: Diagnosis present

## 2018-01-05 DIAGNOSIS — G9340 Encephalopathy, unspecified: Secondary | ICD-10-CM | POA: Diagnosis not present

## 2018-01-05 DIAGNOSIS — Z6820 Body mass index (BMI) 20.0-20.9, adult: Secondary | ICD-10-CM | POA: Diagnosis not present

## 2018-01-05 DIAGNOSIS — I482 Chronic atrial fibrillation: Secondary | ICD-10-CM | POA: Diagnosis present

## 2018-01-05 DIAGNOSIS — E440 Moderate protein-calorie malnutrition: Secondary | ICD-10-CM | POA: Diagnosis present

## 2018-01-05 DIAGNOSIS — E876 Hypokalemia: Secondary | ICD-10-CM | POA: Diagnosis present

## 2018-01-05 DIAGNOSIS — G309 Alzheimer's disease, unspecified: Secondary | ICD-10-CM | POA: Diagnosis not present

## 2018-01-05 DIAGNOSIS — E785 Hyperlipidemia, unspecified: Secondary | ICD-10-CM | POA: Diagnosis present

## 2018-01-05 DIAGNOSIS — R402224 Coma scale, best verbal response, incomprehensible words, 24 hours or more after hospital admission: Secondary | ICD-10-CM | POA: Diagnosis not present

## 2018-01-05 DIAGNOSIS — I10 Essential (primary) hypertension: Secondary | ICD-10-CM | POA: Diagnosis present

## 2018-01-05 DIAGNOSIS — R9401 Abnormal electroencephalogram [EEG]: Secondary | ICD-10-CM | POA: Diagnosis present

## 2018-01-05 DIAGNOSIS — Z7189 Other specified counseling: Secondary | ICD-10-CM | POA: Diagnosis not present

## 2018-01-05 DIAGNOSIS — F0390 Unspecified dementia without behavioral disturbance: Secondary | ICD-10-CM | POA: Diagnosis present

## 2018-01-05 DIAGNOSIS — I2510 Atherosclerotic heart disease of native coronary artery without angina pectoris: Secondary | ICD-10-CM | POA: Diagnosis present

## 2018-01-05 DIAGNOSIS — A419 Sepsis, unspecified organism: Principal | ICD-10-CM | POA: Diagnosis present

## 2018-01-05 LAB — URINALYSIS, ROUTINE W REFLEX MICROSCOPIC
Bilirubin Urine: NEGATIVE
GLUCOSE, UA: NEGATIVE mg/dL
Ketones, ur: 20 mg/dL — AB
Leukocytes, UA: NEGATIVE
Nitrite: NEGATIVE
PROTEIN: 30 mg/dL — AB
Specific Gravity, Urine: 1.021 (ref 1.005–1.030)
pH: 5 (ref 5.0–8.0)

## 2018-01-05 LAB — COMPREHENSIVE METABOLIC PANEL
ALBUMIN: 3.3 g/dL — AB (ref 3.5–5.0)
ALT: 15 U/L (ref 14–54)
ALT: 14 U/L (ref 14–54)
ANION GAP: 4 — AB (ref 5–15)
AST: 20 U/L (ref 15–41)
AST: 20 U/L (ref 15–41)
Albumin: 2.9 g/dL — ABNORMAL LOW (ref 3.5–5.0)
Alkaline Phosphatase: 69 U/L (ref 38–126)
Alkaline Phosphatase: 71 U/L (ref 38–126)
Anion gap: 8 (ref 5–15)
BUN: 24 mg/dL — AB (ref 6–20)
BUN: 19 mg/dL (ref 6–20)
CHLORIDE: 104 mmol/L (ref 101–111)
CHLORIDE: 109 mmol/L (ref 101–111)
CO2: 31 mmol/L (ref 22–32)
CO2: 31 mmol/L (ref 22–32)
CREATININE: 0.85 mg/dL (ref 0.44–1.00)
Calcium: 8.7 mg/dL — ABNORMAL LOW (ref 8.9–10.3)
Calcium: 8.2 mg/dL — ABNORMAL LOW (ref 8.9–10.3)
Creatinine, Ser: 0.82 mg/dL (ref 0.44–1.00)
GFR calc Af Amer: >60 mL/min (ref >60)
GFR calc non Af Amer: 59 mL/min — ABNORMAL LOW (ref >60)
GFR calc non Af Amer: >60 mL/min (ref >60)
GLUCOSE: 97 mg/dL (ref 65–99)
Glucose, Bld: 87 mg/dL (ref 65–99)
POTASSIUM: 2.4 mmol/L — AB (ref 3.5–5.1)
POTASSIUM: 3.5 mmol/L (ref 3.5–5.1)
SODIUM: 144 mmol/L (ref 135–145)
Sodium: 143 mmol/L (ref 135–145)
Total Bilirubin: 0.8 mg/dL (ref 0.3–1.2)
Total Bilirubin: 0.9 mg/dL (ref 0.3–1.2)
Total Protein: 6.7 g/dL (ref 6.5–8.1)
Total Protein: 6.3 g/dL — ABNORMAL LOW (ref 6.5–8.1)

## 2018-01-05 LAB — BLOOD GAS, ARTERIAL
Acid-Base Excess: 3 mmol/L — ABNORMAL HIGH (ref 0.0–2.0)
Bicarbonate: 27 mmol/L (ref 20.0–28.0)
Drawn by: 270161
O2 CONTENT: 4 L/min
O2 SAT: 98.7 %
PATIENT TEMPERATURE: 37
PO2 ART: 137 mmHg — AB (ref 83.0–108.0)
pCO2 arterial: 41.1 mmHg (ref 32.0–48.0)
pH, Arterial: 7.433 (ref 7.350–7.450)

## 2018-01-05 LAB — CBC WITH DIFFERENTIAL/PLATELET
BASOS PCT: 1 %
Basophils Absolute: 0 10*3/uL (ref 0.0–0.1)
EOS ABS: 0.1 10*3/uL (ref 0.0–0.7)
Eosinophils Relative: 1 %
HCT: 35.3 % — ABNORMAL LOW (ref 36.0–46.0)
Hemoglobin: 11.3 g/dL — ABNORMAL LOW (ref 12.0–15.0)
Lymphocytes Relative: 28 %
Lymphs Abs: 1.7 10*3/uL (ref 0.7–4.0)
MCH: 31 pg (ref 26.0–34.0)
MCHC: 32 g/dL (ref 30.0–36.0)
MCV: 96.7 fL (ref 78.0–100.0)
MONO ABS: 0.7 10*3/uL (ref 0.1–1.0)
MONOS PCT: 11 %
Neutro Abs: 3.8 10*3/uL (ref 1.7–7.7)
Neutrophils Relative %: 59 %
Platelets: 179 10*3/uL (ref 150–400)
RBC: 3.65 MIL/uL — ABNORMAL LOW (ref 3.87–5.11)
RDW: 12.6 % (ref 11.5–15.5)
WBC: 6.3 10*3/uL (ref 4.0–10.5)

## 2018-01-05 LAB — I-STAT CG4 LACTIC ACID, ED: LACTIC ACID, VENOUS: 0.68 mmol/L (ref 0.5–1.9)

## 2018-01-05 LAB — MAGNESIUM: Magnesium: 1.9 mg/dL (ref 1.7–2.4)

## 2018-01-05 LAB — PHOSPHORUS: PHOSPHORUS: 3 mg/dL (ref 2.5–4.6)

## 2018-01-05 LAB — MRSA PCR SCREENING: MRSA BY PCR: NEGATIVE

## 2018-01-05 LAB — AMMONIA: Ammonia: 17 umol/L (ref 9–35)

## 2018-01-05 MED ORDER — PRO-STAT SUGAR FREE PO LIQD
30.0000 mL | Freq: Two times a day (BID) | ORAL | Status: DC
  Administered 2018-01-05: 30 mL via ORAL
  Filled 2018-01-05 (×3): qty 30

## 2018-01-05 MED ORDER — VANCOMYCIN HCL 500 MG IV SOLR
500.0000 mg | Freq: Two times a day (BID) | INTRAVENOUS | Status: DC
  Administered 2018-01-05 – 2018-01-06 (×3): 500 mg via INTRAVENOUS
  Filled 2018-01-05 (×5): qty 500

## 2018-01-05 MED ORDER — KETOROLAC TROMETHAMINE 15 MG/ML IJ SOLN
15.0000 mg | Freq: Once | INTRAMUSCULAR | Status: AC
  Administered 2018-01-05: 15 mg via INTRAVENOUS
  Filled 2018-01-05: qty 1

## 2018-01-05 MED ORDER — MAGNESIUM SULFATE 2 GM/50ML IV SOLN
2.0000 g | Freq: Once | INTRAVENOUS | Status: AC
  Administered 2018-01-05: 2 g via INTRAVENOUS
  Filled 2018-01-05: qty 50

## 2018-01-05 MED ORDER — LEVETIRACETAM 500 MG PO TABS
500.0000 mg | ORAL_TABLET | Freq: Two times a day (BID) | ORAL | Status: DC
  Administered 2018-01-05 – 2018-01-06 (×2): 500 mg via ORAL
  Filled 2018-01-05 (×3): qty 1

## 2018-01-05 MED ORDER — ONDANSETRON HCL 4 MG/2ML IJ SOLN: 4.0000 mg | Freq: Four times a day (QID) | INTRAMUSCULAR | Status: DC | PRN

## 2018-01-05 MED ORDER — ESLICARBAZEPINE ACETATE 400 MG PO TABS
1.0000 | ORAL_TABLET | Freq: Every day | ORAL | Status: DC
  Administered 2018-01-06: 1 via ORAL
  Filled 2018-01-05 (×5): qty 1

## 2018-01-05 MED ORDER — VITAMIN B-12 1000 MCG PO TABS
2000.0000 ug | ORAL_TABLET | Freq: Every day | ORAL | Status: DC
  Administered 2018-01-06: 2000 ug via ORAL
  Filled 2018-01-05 (×2): qty 2

## 2018-01-05 MED ORDER — ONDANSETRON HCL 4 MG PO TABS: 4.0000 mg | ORAL_TABLET | Freq: Four times a day (QID) | ORAL | Status: DC | PRN

## 2018-01-05 MED ORDER — MAGNESIUM OXIDE 400 (241.3 MG) MG PO TABS
200.0000 mg | ORAL_TABLET | Freq: Every day | ORAL | Status: DC
  Administered 2018-01-06: 200 mg via ORAL
  Filled 2018-01-05 (×2): qty 1

## 2018-01-05 MED ORDER — POLYETHYLENE GLYCOL 3350 17 G PO PACK
17.0000 g | PACK | Freq: Every day | ORAL | Status: DC
  Filled 2018-01-05 (×2): qty 1

## 2018-01-05 MED ORDER — AMLODIPINE BESYLATE 5 MG PO TABS
5.0000 mg | ORAL_TABLET | Freq: Every day | ORAL | Status: DC
  Administered 2018-01-06: 5 mg via ORAL
  Filled 2018-01-05 (×2): qty 1

## 2018-01-05 MED ORDER — ENOXAPARIN SODIUM 40 MG/0.4ML ~~LOC~~ SOLN
40.0000 mg | SUBCUTANEOUS | Status: DC
  Administered 2018-01-05 – 2018-01-08 (×4): 40 mg via SUBCUTANEOUS
  Filled 2018-01-05 (×4): qty 0.4

## 2018-01-05 MED ORDER — ASPIRIN EC 81 MG PO TBEC
81.0000 mg | DELAYED_RELEASE_TABLET | Freq: Every day | ORAL | Status: DC
  Administered 2018-01-06: 81 mg via ORAL
  Filled 2018-01-05 (×3): qty 1

## 2018-01-05 MED ORDER — SERTRALINE HCL 50 MG PO TABS
25.0000 mg | ORAL_TABLET | Freq: Every day | ORAL | Status: DC
  Administered 2018-01-06: 25 mg via ORAL
  Filled 2018-01-05 (×2): qty 1

## 2018-01-05 MED ORDER — ACETAMINOPHEN 325 MG PO TABS
650.0000 mg | ORAL_TABLET | Freq: Four times a day (QID) | ORAL | Status: DC | PRN
  Administered 2018-01-05: 650 mg via ORAL
  Filled 2018-01-05: qty 2

## 2018-01-05 MED ORDER — ACETAMINOPHEN 650 MG RE SUPP: 650.0000 mg | Freq: Four times a day (QID) | RECTAL | Status: DC | PRN

## 2018-01-05 MED ORDER — POTASSIUM CHLORIDE IN NACL 40-0.9 MEQ/L-% IV SOLN
INTRAVENOUS | Status: DC
Start: 2018-01-05 — End: 2018-01-08
  Administered 2018-01-05 (×2): 125 mL/h via INTRAVENOUS
  Administered 2018-01-06 – 2018-01-07 (×4): 75 mL/h via INTRAVENOUS
  Filled 2018-01-05 (×2): qty 1000

## 2018-01-05 MED ORDER — CLONAZEPAM 0.5 MG PO TABS
0.2500 mg | ORAL_TABLET | Freq: Two times a day (BID) | ORAL | Status: DC
  Administered 2018-01-05 – 2018-01-06 (×3): 0.25 mg via ORAL
  Filled 2018-01-05 (×5): qty 1

## 2018-01-05 MED ORDER — PANTOPRAZOLE SODIUM 40 MG PO TBEC
40.0000 mg | DELAYED_RELEASE_TABLET | Freq: Every day | ORAL | Status: DC
  Administered 2018-01-06: 40 mg via ORAL
  Filled 2018-01-05 (×2): qty 1

## 2018-01-05 MED ORDER — POTASSIUM CHLORIDE 10 MEQ/100ML IV SOLN
10.0000 meq | INTRAVENOUS | Status: AC
  Administered 2018-01-05 (×4): 10 meq via INTRAVENOUS
  Filled 2018-01-05 (×4): qty 100

## 2018-01-05 MED ORDER — HYDRALAZINE HCL 20 MG/ML IJ SOLN
10.0000 mg | INTRAMUSCULAR | Status: DC | PRN
  Administered 2018-01-05 – 2018-01-07 (×3): 10 mg via INTRAVENOUS
  Filled 2018-01-05 (×3): qty 1

## 2018-01-05 MED ORDER — PIPERACILLIN-TAZOBACTAM 3.375 G IVPB
3.3750 g | Freq: Three times a day (TID) | INTRAVENOUS | Status: DC
  Administered 2018-01-05 – 2018-01-08 (×10): 3.375 g via INTRAVENOUS
  Filled 2018-01-05 (×9): qty 50

## 2018-01-05 NOTE — H&P (Signed)
History and Physical    Joan Mathis TMH:[id number] DOB: 12/30/1925 DOA: 01/04/2018  PCP: Hilbert Corrigan, MD   Patient coming from: [place].  I have personally briefly reviewed patient's old medical records in [place]  Chief Complaint: AMS.  HPI: Joan Mathis is a 84 y.o. female with medical history significant of acid reflux, arthritis, CAD, atrial fibrillation, legally blind, history of ocular stroke, dementia, dysphagia, hyperlipidemia, hypertension, history of right kidney mass, seizure disorder who was brought from a nearby nursing facility due to altered mental status and fever.  She was given an acetaminophen suppository for fever earlier at the facility.  No further history available due to the patient's mental state.  ED Course: Temperature is 99.3 F, pulse 60, respirations 15, blood pressure 159/72 mmHg and O2 sat 99% on nasal cannula oxygen.  The patient received Zosyn, vancomycin and IV fluids in the ED.  Urinalysis showed moderate hemoglobinuria, ketonuria of 20 and proteinuria 30 mg/dL.  Only few bacteria on microscopic exam.  White count is 5.2 with a normal differential.  Hemoglobin 11.5 g/dL and platelets 189.  Sodium 143, potassium 2.4, chloride 104 and CO2 31 mmol/L.  Glucose 97, magnesium 1.9, BUN 24, creatinine 0.85 and calcium 8.7 mg/dL.  Total protein 6.7 and albumin mildly decreased at 3.3 g/dL.  All other hepatic function values were normal.  Review of Systems: Unable to review.   Past Medical History:  Diagnosis Date  . Acid reflux   . Arthritis   . Atherosclerotic cardiovascular disease   . Atrial fibrillation ([place])   . Blindness    legal blindness,left eye,histor  . Dementia   . Dysphagia   . FH: dementia   . H/O: stroke    ocular stroke,   . Hypercholesteremia   . Hyperlipemia   . Hypertension   . Postmenopausal   . Right kidney mass 02/14/2016   Consistent with a renal cyst per ultrasound   . Seizures ([place])   . Stroke  [facility])    tia per nursing facilities  . Trochanteric bursitis     History reviewed. No pertinent surgical history.   reports that she has never smoked. She has never used smokeless tobacco. She reports that she does not drink alcohol or use drugs.  No Known Allergies  Family history Unable to obtain family history at this time due to AMS.  Prior to Admission medications   Medication Sig Start Date End Date Taking? Authorizing Provider  Amino Acids-Protein Hydrolys (FEEDING SUPPLEMENT, PRO-STAT SUGAR FREE 64,) LIQD Take 30 mLs by mouth 2 (two) times daily. 02/15/16  Yes Rexene Alberts, MD  amLODipine (NORVASC) 5 MG tablet Take 1 tablet (5 mg total) by mouth daily. 03/26/17  Yes Kathie Dike, MD  aspirin 81 MG EC tablet Take 1 tablet (81 mg total) by mouth at bedtime. Swallow whole. Patient taking differently: Take 81 mg by mouth daily. Swallow whole. 02/15/16  Yes Rexene Alberts, MD  clonazePAM (KLONOPIN) 0.5 MG tablet Take 0.25 mg by mouth 2 (two) times daily.   Yes [provider]  cyanocobalamin 2000 MCG tablet Take 2,000 mcg by mouth daily.   Yes [provider]  Eslicarbazepine Acetate 400 MG TABS Take 1 tablet by mouth daily.   Yes [provider]  levETIRAcetam (KEPPRA) 500 MG tablet Take 500 mg by mouth 2 (two) times daily.   Yes [provider]  Magnesium Oxide 200 MG TABS Take 1 tablet (200 mg total) by mouth daily. 02/15/16  Yes Rexene Alberts, MD  omeprazole (PRILOSEC) 20 MG capsule Take 20 mg by mouth daily.   Yes [provider]  polyethylene glycol (MIRALAX / GLYCOLAX) packet Take 17 g by mouth 2 (two) times daily. Patient taking differently: Take 17 g by mouth daily.  02/15/16  Yes Rexene Alberts, MD  sertraline (ZOLOFT) 25 MG tablet Take 25 mg by mouth daily.   Yes [provider]    Physical Exam: Vitals:   01/05/18 0033 01/05/18 0100 01/05/18 0103 01/05/18 0130  BP: (!) 159/72 (!) 165/77  (!) 165/91  Pulse: 60 (!)  52    Resp: 15 (!) 25  12  Temp:   99.3 F (37.4 C)   TempSrc:   Rectal   SpO2: 99% 100%      Constitutional: NAD, calm, comfortable Eyes: PERRL, lids and conjunctivae normal ENMT: Mucous membranes are dry.  Posterior pharynx clear of any exudate or lesions. Neck: normal, supple, no masses, no thyromegaly Respiratory: Tachypneic in the high 20s and low 30s.  Bilateral rhonchi.  No wheezing.  No accessory muscle use. Cardiovascular: Bradycardic at 58 bpm, no murmurs / rubs / gallops. No extremity edema. 2+ pedal pulses. No carotid bruits.  Abdomen: Soft, no tenderness, no masses palpated. No hepatosplenomegaly. Bowel sounds positive.  Musculoskeletal: no clubbing / cyanosis.  Good ROM, no contractures. Normal muscle tone.  Skin: Multiple areas of ecchymosis on lower and upper extremities.  Right pretibial area shows a healing wound.  Please see images below for further detail. Neurologic: Stuporous.  Response to tactile stimuli. Psychiatric: Stuporous.  Unable to further evaluate.            Labs on Admission: I have personally reviewed following labs and imaging studies  CBC: Recent Labs  Lab 01/04/18 2318  WBC 5.2  NEUTROABS 3.5  HGB 11.5*  HCT 35.4*  MCV 95.9  PLT 468   Basic Metabolic Panel: Recent Labs  Lab 01/04/18 2318  NA 143  K 2.4*  CL 104  CO2 31  GLUCOSE 97  BUN 24*  CREATININE 0.85  CALCIUM 8.7*  MG 1.9   GFR: CrCl cannot be calculated (Unknown ideal weight.). Liver Function Tests: Recent Labs  Lab 01/04/18 2318  AST 20  ALT 15  ALKPHOS 69  BILITOT 0.8  PROT 6.7  ALBUMIN 3.3*   No results for input(s): LIPASE, AMYLASE in the last 168 hours. No results for input(s): AMMONIA in the last 168 hours. Coagulation Profile: No results for input(s): INR, PROTIME in the last 168 hours. Cardiac Enzymes: No results for input(s): CKTOTAL, CKMB, CKMBINDEX, TROPONINI in the last 168 hours. BNP (last 3 results) No results for input(s): PROBNP  in the last 8760 hours. HbA1C: No results for input(s): HGBA1C in the last 72 hours. CBG: No results for input(s): GLUCAP in the last 168 hours. Lipid Profile: No results for input(s): CHOL, HDL, LDLCALC, TRIG, CHOLHDL, LDLDIRECT in the last 72 hours. Thyroid Function Tests: No results for input(s): TSH, T4TOTAL, FREET4, T3FREE, THYROIDAB in the last 72 hours. Anemia Panel: No results for input(s): VITAMINB12, FOLATE, FERRITIN, TIBC, IRON, RETICCTPCT in the last 72 hours. Urine analysis:    Component Value Date/Time   COLORURINE AMBER (A) 01/04/2018 0021   APPEARANCEUR CLOUDY (A) 01/04/2018 0021   LABSPEC 1.021 01/04/2018 0021   PHURINE 5.0 01/04/2018 0021   GLUCOSEU NEGATIVE 01/04/2018 0021   HGBUR MODERATE (A) 01/04/2018 0021   BILIRUBINUR NEGATIVE 01/04/2018 0021   KETONESUR 20 (A) 01/04/2018 0021  PROTEINUR 30 (A) 01/04/2018 0021   UROBILINOGEN 2.0 (H) 04/02/2015 1945   NITRITE NEGATIVE 01/04/2018 0021   LEUKOCYTESUR NEGATIVE 01/04/2018 0021    Radiological Exams on Admission: Dg Chest Port 1 View  Result Date: 01/04/2018 CLINICAL DATA:  Fever and altered level of consciousness. EXAM: PORTABLE CHEST 1 VIEW COMPARISON:  None. FINDINGS: The patient is tilted and rotated to the left. Heart size is top-normal with tortuous atherosclerotic aorta. No overt pulmonary edema, effusion or pneumothorax. Summation of overlapping ribs and pulmonary vessels in the left upper lobe are believed to account for the increased opacity seen in the left upper lobe. Pneumonia is believed less likely. IMPRESSION: Aortic atherosclerosis with borderline cardiomegaly. No active pulmonary disease. Electronically Signed   By: Ashley Royalty M.D.   On: 01/04/2018 23:48   02/13/2016 echocardiogram complete ------------------------------------------------------------------- LV EF: 55% -   60%  ------------------------------------------------------------------- Indications:      Elevated  Troponins.  ------------------------------------------------------------------- History:   PMH:   Altered mental status.  Coronary artery disease. PMH:  Dementia.  Risk factors:  Hypertension. Dyslipidemia.  ------------------------------------------------------------------- Study Conclusions  - Left ventricle: The cavity size was normal. Wall thickness was   increased in a pattern of mild LVH. Systolic function was normal.   The estimated ejection fraction was in the range of 55% to 60%.   There is hypokinesis of the basalinferior myocardium. Doppler   parameters are consistent with abnormal left ventricular   relaxation (grade 1 diastolic dysfunction). - Aortic valve: Mildly to moderately calcified annulus. Probably   trileaflet; mildly calcified leaflets. There was trivial   regurgitation. - Mitral valve: Calcified annulus. There was mild regurgitation. - Left atrium: The atrium was at the upper limits of normal in   size. - Tricuspid valve: There was trivial regurgitation. - Pulmonary arteries: Systolic pressure could not be accurately   estimated. - Pericardium, extracardiac: There was no pericardial effusion.  Impressions:  - Mild LVH with LVEF 55-60%. Basal inferior hypokinesis. Grade 1   diastolic dysfunction with normal estimated LV filling pressure.   Upper normal left atrial chamber size. MAC with mild mitral   regurgitation. Sclerotic aortic valve without obvious stenosis,   trivial aortic regurgitation. Trivial tricuspid regurgitation.  EKG: Independently reviewed. Vent. rate 88 BPM PR interval * ms QRS duration 106 ms QT/QTc 359/435 ms P-R-T axes 169 192 28 Sinus or ectopic atrial rhythm Multiform ventricular premature complexes Inferior infarct, old Baseline wander in lead(s) V3  Assessment/Plan Principal Problem:   Sepsis due to undetermined organism ([place]) Admit to stepdown/inpatient. Continue supplemental oxygen. Continue IV fluids. Vancomycin  and Zosyn per pharmacy. Follow-up blood cultures and sensitivity.  Active Problems:   Altered mental status Secondary to sepsis. Continue treatment as above. Supportive care.    Dementia without behavioral disturbance As above. Supportive care.    Essential hypertension, benign Continue amlodipine 5 mg p.o. daily. Monitor blood pressure.    CAD, NATIVE VESSEL Continue aspirin.    Hyperlipidemia Not on medical therapy at this time.    Hypokalemia Replacing. Follow-up potassium level.    Chronic atrial fibrillation (HCC) CHA?DS?-VASc Score of at least 5. Currently in sinus rhythm. Currently not on anticoagulation or rate control agent.    Seizure disorder (HCC) Continue Keppra 500 mg p.o. twice daily. Continue eslicarbazepine 009 mg p.o. daily    DVT prophylaxis: Lovenox SQ. Code Status: Full code. Family Communication:  Disposition Plan: Admit for IV antibiotic therapy and further work-up. Consults called:  Admission status: Inpatient/stepdown.   Shanon Brow  Judieth Keens MD [facility] Pager [phone].  If 7PM-7AM, please contact night-coverage [email or web address] Password [email or web address]  01/05/2018, 1:46 AM

## 2018-01-05 NOTE — Procedures (Signed)
ELECTROENCEPHALOGRAM REPORT  Date of Study: 01/05/2018  Patient's Name: Joan Mathis MRN: [id number] Date of Birth: [birth year]  Referring Provider: Dr. Erick BlinksJehanzeb Memon  Clinical History: This is an 84 year old woman with advanced dementia and seizures, less interactive  Medications: Eslicarbazepine Acetate TABS 1 tablet  clonazePAM (KLONOPIN) tablet 0.25 mg  levETIRAcetam (KEPPRA) tablet 500 mg  acetaminophen (TYLENOL) tablet 650 mg  amLODipine (NORVASC) tablet 5 mg  aspirin EC tablet 81 mg  enoxaparin (LOVENOX) injection 40 mg  feeding supplement (PRO-STAT SUGAR FREE 64) liquid 30 mL  hydrALAZINE (APRESOLINE) injection 10 mg  magnesium oxide (MAG-OX) tablet 200 mg  pantoprazole (PROTONIX) EC tablet 40 mg  piperacillin-tazobactam (ZOSYN) IVPB 3.375 g  polyethylene glycol (MIRALAX / GLYCOLAX) packet 17 g  sertraline (ZOLOFT) tablet 25 mg  vancomycin (VANCOCIN) 500 mg in sodium chloride 0.9 % 100 mL IVPB  vitamin B-12 (CYANOCOBALAMIN) tablet 2,000 mcg   Technical Summary: A multichannel digital EEG recording measured by the international 10-20 system with electrodes applied with paste and impedances below 5000 ohms performed as portable with EKG monitoring in an predominantly drowsy patient.  Hyperventilation and photic stimulation were not performed.  The digital EEG was referentially recorded, reformatted, and digitally filtered in a variety of bipolar and referential montages for optimal display.   Description: The patient is awake and drowsy during the recording. She does not follow commands. There is no clear posterior dominant rhythm seen. The background consists of a large amount of diffuse 4-6 Hz theta and occasional 2-3 Hz delta slowing. There is slight increase in faster frequencies with stimulation. Normal sleep architecture is not seen. Hyperventilation and photic stimulation were not performed. There is muscle artifact obscuring majority of the left temporal region, in  between artifact, here were no epileptiform discharges or electrographic seizures seen.    EKG lead was unremarkable.  Impression: This awake and drowsy EEG is abnormal due to moderate diffuse background slowing.  Clinical Correlation of the above findings indicates diffuse cerebral dysfunction that is non-specific in etiology and can be seen with hypoxic/ischemic injury, toxic/metabolic encephalopathies, neurodegenerative disorders, or medication effect. This EEG is slightly limited due to muscle artifact obscuring majority of left temporal region. The absence of epileptiform discharges does not rule out a clinical diagnosis of epilepsy.  Clinical correlation is advised.   Patrcia DollyKaren Galilea [place], M.D.

## 2018-01-05 NOTE — Progress Notes (Signed)
Patient admitted to the hospital earlier this morning by Dr. Robb Matarrtiz.  Patient seen and examined.  She is awake, sitting up in bed, nonverbal, does not interact or follow commands.  84 year old female with advanced dementia who resides at a nursing facility was brought to the hospital with change in mental status and fever.  She was noted to be tachypneic on arrival with bilateral rhonchi.  Concerns for aspiration.  Initial chest x-ray did not show any signs of pneumonia.  She was clinically dehydrated.  Started on IV fluids and intravenous antibiotics.  Mental status appears to have improved since admission.  Still has not returned to baseline.  Daughter reports that she does not recognize her at baseline, but does interact with staff at facility.  Appetite is usually fairly good at the facility.  She has not eaten anything since coming to the hospital.  I explained that with her acute illness and advanced dementia, her appetite may or may not return.  Will need to monitor clinically.  Continue current treatments for now.  Repeat chest x-ray in a.m. tomorrow to evaluate for developing pneumonia.  Darden RestaurantsJehanzeb Vernal Mathis

## 2018-01-05 NOTE — Progress Notes (Signed)
EEG completed; results pending.    

## 2018-01-05 NOTE — ED Notes (Signed)
CRITICAL VALUE ALERT  Critical Value:  Potassium 2.4 Date & Time Notied: 01/05/18@0007  Provider Notified: Rancour Orders Received/Actions taken: None yet

## 2018-01-05 NOTE — Progress Notes (Signed)
Pharmacy Antibiotic Note  Joan Mathis is a 84 y.o. female admitted on 01/04/2018 with sepsis.  Pharmacy has been consulted for Vancomycin and zosyn dosing.  Plan: Vancomycin 1000mg  loading dose, then 500mg  IV every 12 hours.  Goal trough 15-20 mcg/mL. Zosyn 3.375g IV q8h (4 hour infusion).  F/U cxs and clinical progress Monitor V/S, labs and levels as indicated  Height: 5\' 6"  (167.6 cm) Weight: 123 lb 14.4 oz (56.2 kg) IBW/kg (Calculated) : 59.3  Temp (24hrs), Avg:99.3 F (37.4 C), Min:99.3 F (37.4 C), Max:99.3 F (37.4 C)  Recent Labs  Lab 01/04/18 2318 01/05/18 0031 01/05/18 0508  WBC 5.2  --  6.3  CREATININE 0.85  --  0.82  LATICACIDVEN  --  0.68  --     Estimated Creatinine Clearance: 41.3 mL/min (by C-G formula based on SCr of 0.82 mg/dL).    No Known Allergies  Antimicrobials this admission: Vancomycin 6/9 >>  Zosyn 6/9 >>   Dose adjustments this admission: n/a  Microbiology results: 6/9 BCx: pending 6/9 UCx: pending  MRSA PCR:   Thank you for allowing pharmacy to be a part of this patient's care.  Joan Mathis, BS Pharm D, [place] Clinical Pharmacist Pager [phone]  01/05/2018 7:54 AM

## 2018-01-06 ENCOUNTER — Inpatient Hospital Stay (HOSPITAL_COMMUNITY): Payer: Medicare Other

## 2018-01-06 DIAGNOSIS — G9340 Encephalopathy, unspecified: Secondary | ICD-10-CM | POA: Diagnosis present

## 2018-01-06 DIAGNOSIS — F0280 Dementia in other diseases classified elsewhere without behavioral disturbance: Secondary | ICD-10-CM

## 2018-01-06 DIAGNOSIS — E440 Moderate protein-calorie malnutrition: Secondary | ICD-10-CM

## 2018-01-06 DIAGNOSIS — G40909 Epilepsy, unspecified, not intractable, without status epilepticus: Secondary | ICD-10-CM

## 2018-01-06 DIAGNOSIS — J690 Pneumonitis due to inhalation of food and vomit: Secondary | ICD-10-CM

## 2018-01-06 DIAGNOSIS — G309 Alzheimer's disease, unspecified: Secondary | ICD-10-CM

## 2018-01-06 DIAGNOSIS — N390 Urinary tract infection, site not specified: Secondary | ICD-10-CM | POA: Diagnosis present

## 2018-01-06 DIAGNOSIS — I482 Chronic atrial fibrillation: Secondary | ICD-10-CM

## 2018-01-06 DIAGNOSIS — I10 Essential (primary) hypertension: Secondary | ICD-10-CM

## 2018-01-06 LAB — CBC WITH DIFFERENTIAL/PLATELET
BASOS ABS: 0 10*3/uL (ref 0.0–0.1)
BASOS PCT: 1 %
Eosinophils Absolute: 0.1 10*3/uL (ref 0.0–0.7)
Eosinophils Relative: 2 %
HEMATOCRIT: 36.3 % (ref 36.0–46.0)
HEMOGLOBIN: 11.6 g/dL — AB (ref 12.0–15.0)
LYMPHS PCT: 29 %
Lymphs Abs: 1.5 10*3/uL (ref 0.7–4.0)
MCH: 30.9 pg (ref 26.0–34.0)
MCHC: 32 g/dL (ref 30.0–36.0)
MCV: 96.5 fL (ref 78.0–100.0)
Monocytes Absolute: 0.6 10*3/uL (ref 0.1–1.0)
Monocytes Relative: 11 %
NEUTROS ABS: 2.9 10*3/uL (ref 1.7–7.7)
NEUTROS PCT: 57 %
Platelets: 172 10*3/uL (ref 150–400)
RBC: 3.76 MIL/uL — AB (ref 3.87–5.11)
RDW: 12.8 % (ref 11.5–15.5)
WBC: 5.2 10*3/uL (ref 4.0–10.5)

## 2018-01-06 LAB — BASIC METABOLIC PANEL
ANION GAP: 6 (ref 5–15)
BUN: 21 mg/dL — ABNORMAL HIGH (ref 6–20)
CALCIUM: 8.8 mg/dL — AB (ref 8.9–10.3)
CO2: 29 mmol/L (ref 22–32)
Chloride: 109 mmol/L (ref 101–111)
Creatinine, Ser: 0.63 mg/dL (ref 0.44–1.00)
GFR calc non Af Amer: >60 mL/min (ref >60)
GLUCOSE: 88 mg/dL (ref 65–99)
Potassium: 3.8 mmol/L (ref 3.5–5.1)
Sodium: 144 mmol/L (ref 135–145)

## 2018-01-06 MED ORDER — CLONAZEPAM 0.5 MG PO TABS: 0.2500 mg | ORAL_TABLET | Freq: Two times a day (BID) | ORAL | Status: DC

## 2018-01-06 MED ORDER — ORAL CARE MOUTH RINSE
15.0000 mL | Freq: Two times a day (BID) | OROMUCOSAL | Status: DC
  Administered 2018-01-06 – 2018-01-07 (×3): 15 mL via OROMUCOSAL

## 2018-01-06 MED ORDER — LEVETIRACETAM 500 MG PO TABS
500.0000 mg | ORAL_TABLET | Freq: Two times a day (BID) | ORAL | Status: DC
  Administered 2018-01-06: 500 mg via ORAL
  Filled 2018-01-06: qty 1

## 2018-01-06 NOTE — Progress Notes (Signed)
Initial Nutrition Assessment  DOCUMENTATION CODES:   Non-severe (moderate) malnutrition in context of chronic illness  INTERVENTION:   Wait for swallow eval.  D/c Pro-stat.  NUTRITION DIAGNOSIS:   Moderate Malnutrition related to chronic illness(dementia) as evidenced by NPO status, moderate fat depletion, moderate muscle depletion.  GOAL:   Patient will meet greater than or equal to 90% of their needs  MONITOR:   PO intake, Weight trends  REASON FOR ASSESSMENT:   Low Braden   ASSESSMENT:  84 y/o female with Hx of acid reflux, afib, CAD, legally blind, dementia, dysphagia, HLD, HTN, hx of right kidney mass, stroke, and seizures.  Admitted for fever and AMS and presented with dehydration, sepsis due to underlying condition, and generalized weakness.  Low Braden score of 11.    Pt is unable to respond due to dementia/AMS and therefore RD intern was unable to obtain diet/appetite hx.  RN also did not have an hx regarding this subject, but did say pt did not do well with fluid or with taking her pills.  RN mentioned potentially getting a swallow eval.  Previous note by physician did mention pt's appetite is usually fairly good at the facility.  Pt is currently NPO and has not eaten anything since prior to admission.    Pt's wt is currently 123 lbs dry (01/05/2018).  From recorded wt hx, pt's UBW appears to be between 130-140 lbs.  Pt experienced a 13 lb wt loss, or 9.5% bw, over 9 months time.  This is not clinically significant for malnutrition, however, a NFPE showed mostly moderate, and some severe, signs of malnutrition, which clinically does qualify pt for severe malnutrition in context of an acute illness or injury according to AND/ASPEN malnutrition guidelines.    Intervention at this time will be to await swallow eval and d/c pro-stat since RN noted pt is "strangled on liquids".   Labs reviewed: BUN 21 Recent Labs  Lab 01/04/18 2318 01/05/18 0508 01/06/18 0543  NA 143  144 144  K 2.4* 3.5 3.8  CL 104 109 109  CO2 31 31 29   BUN 24* 19 21*  CREATININE 0.85 0.82 0.63  CALCIUM 8.7* 8.2* 8.8*  MG 1.9  --   --   PHOS  --  3.0  --   GLUCOSE 97 87 88   Medications:  Klonopin, lovenox, mag-ox, protonix, miralax, zoloft, vit B12, saline, zosyn, vancocin  Past Medical History:  Diagnosis Date  . Acid reflux   . Arthritis   . Atherosclerotic cardiovascular disease   . Atrial fibrillation (HCC)   . Blindness    legal blindness,left eye,histor  . Dementia   . Dysphagia   . FH: dementia   . H/O: stroke    ocular stroke,   . Hypercholesteremia   . Hyperlipemia   . Hypertension   . Postmenopausal   . Right kidney mass 02/14/2016   Consistent with a renal cyst per ultrasound   . Seizures (HCC)   . Stroke [facility](HCC)    tia per nursing facilities  . Trochanteric bursitis    NUTRITION - FOCUSED PHYSICAL EXAM:    Most Recent Value  Orbital Region  Moderate depletion  Upper Arm Region  Mild depletion  Thoracic and Lumbar Region  Moderate depletion  Temple Region  Severe depletion  Clavicle Bone Region  Moderate depletion  Clavicle and Acromion Bone Region  Moderate depletion  Scapular Bone Region  Severe depletion  Dorsal Hand  Unable to assess [pt had hands clasped together  and unable to respond to move them]  Anterior Thigh Region  Severe depletion  Posterior Calf Region  Unable to assess [pt unable to respond to commands to bend leg.  Legs propped on pilllow.]  Edema (RD Assessment)  None  Hair  Reviewed  Eyes  Other (Comment) [accumulation of 'eye goo' around eyes]  Skin  Reviewed  Nails  Unable to assess [hands clasped]     Diet Order:   Diet Order           Diet NPO time specified  Diet effective now         EDUCATION NEEDS:   No education needs have been identified at this time  Skin:  Skin Assessment: Reviewed RN Assessment  Last BM:  not recorded  Height:   Ht Readings from Last 1 Encounters:  01/05/18 5\' 6"  (1.676 m)    Weight:   Wt Readings from Last 1 Encounters:  01/06/18 125 lb 7.1 oz (56.9 kg)   Ideal Body Weight:  58.9 kg  BMI:  Body mass index is 20.25 kg/m.  Estimated Nutritional Needs:   Kcal:  1420-1600 kcal (25-28 kcal/kg BW)  Protein:  71-80 g (20% kcal)  Fluid:  >/= 1420 mL (25 mL/kg BW)

## 2018-01-06 NOTE — Progress Notes (Signed)
PROGRESS NOTE    Joan Mathis  UEA:[id number] DOB: [birth year] DOA: 01/04/2018 PCP: Bernerd Limbo, MD    Brief Narrative:  84 year old female with a history of dementia who is a resident of a nursing facility, admitted to the hospital with altered mental status and sepsis.  Found to have evidence of urinary tract infection and possible aspiration pneumonia.  Started on intravenous antibiotics.  She remains lethargic at this time.  Hemodynamics are stable.  Continue IV antibiotics for now.   Assessment & Plan:   Principal Problem:   Sepsis due to undetermined organism [facility]) Active Problems:   Essential hypertension, benign   CAD, NATIVE VESSEL   Hyperlipidemia   Dementia without behavioral disturbance   Altered mental status   Hypokalemia   Chronic atrial fibrillation (HCC)   Seizure disorder (HCC)   Malnutrition of moderate degree   Acute encephalopathy   Acute lower UTI   1. Sepsis.  Etiologies include UTI versus aspiration pneumonia.  Hemodynamically she is stable.  She was having fevers at the time of admission, but has been afebrile since admission.  Blood cultures have shown no growth.  Urine culture showing E. coli.  Continue IV antibiotics.   2. Urinary tract infection.  Positive for E. coli.  Currently on IV antibiotics.  Follow-up with your sensitivities. 3. Aspiration pneumonia.  Patient has altered mental status and appears to be very rhonchorous.  Continue on IV antibiotics for now.  MRSA PCR is negative.  Vancomycin has been discontinued.  Continue on Zosyn for now. 4. Acute encephalopathy.  Superimposed on baseline dementia.  Likely exacerbated by underlying infection and acute illness.  She is still lethargic at this time.  ABG and ammonia are unrevealing.  Continue to monitor. 5. Hypertension.  Blood pressure currently stable.  Continue to monitor.  She is on amlodipine. 6. Seizure disorder.  She is chronically on Keppra.  EEG does not show any epileptic form  discharges.  Since she remains lethargic, will change Keppra to IV for now. 7. Chronic atrial fibrillation.  Currently in sinus rhythm.  Not on chronic anticoagulation.  This can be readdressed when she stabilizes.  Heart rate is currently controlled.   DVT prophylaxis: lovenox Code Status: DNR  Family Communication: discussed with daughter on 6/10  Disposition Plan: return to SNF on discharge   Consultants:     Procedures:   EEG: This awake and drowsy EEG is abnormal due to moderate diffuse background slowing.  Antimicrobials:   Vancomycin 6/9>6/11  Zosyn 6/9>    Subjective: Lethargic, does not wake up to voice  Objective: Vitals:   01/06/18 1400 01/06/18 1500 01/06/18 1600 01/06/18 1607  BP: (!) 154/87 127/79 (!) 121/94   Pulse: 63 (!) 55 (!) 52   Resp: 15 (!) 8 18   Temp:    (!) 97.5 F (36.4 C)  TempSrc:    Axillary  SpO2:      Weight:      Height:        Intake/Output Summary (Last 24 hours) at 01/06/2018 1636 Last data filed at 01/06/2018 1500 Gross per 24 hour  Intake 885 ml  Output 750 ml  Net 135 ml   Filed Weights   01/05/18 0745 01/06/18 0500  Weight: 56.2 kg (123 lb 14.4 oz) 56.9 kg (125 lb 7.1 oz)    Examination:  General exam: somnolent, difficult to arouse Respiratory system: bilateral rhonchi. Respiratory effort normal. Cardiovascular system: S1 & S2 heard, RRR. No JVD, murmurs, rubs, gallops or  clicks. No pedal edema. Gastrointestinal system: Abdomen is nondistended, soft and nontender. No organomegaly or masses felt. Normal bowel sounds heard. Central nervous system: No focal neurological deficits. Extremities: Symmetric 5 x 5 power. Skin: No rashes, lesions or ulcers Psychiatry: somnolent    Data Reviewed: I have personally reviewed following labs and imaging studies  CBC: Recent Labs  Lab 01/04/18 2318 01/05/18 0508 01/06/18 0543  WBC 5.2 6.3 5.2  NEUTROABS 3.5 3.8 2.9  HGB 11.5* 11.3* 11.6*  HCT 35.4* 35.3* 36.3  MCV  95.9 96.7 96.5  PLT 189 179 172   Basic Metabolic Panel: Recent Labs  Lab 01/04/18 2318 01/05/18 0508 01/06/18 0543  NA 143 144 144  K 2.4* 3.5 3.8  CL 104 109 109  CO2 31 31 29   GLUCOSE 97 87 88  BUN 24* 19 21*  CREATININE 0.85 0.82 0.63  CALCIUM 8.7* 8.2* 8.8*  MG 1.9  --   --   PHOS  --  3.0  --    GFR: Estimated Creatinine Clearance: 42.8 mL/min (by C-G formula based on SCr of 0.63 mg/dL). Liver Function Tests: Recent Labs  Lab 01/04/18 2318 01/05/18 0508  AST 20 20  ALT 15 14  ALKPHOS 69 71  BILITOT 0.8 0.9  PROT 6.7 6.3*  ALBUMIN 3.3* 2.9*   No results for input(s): LIPASE, AMYLASE in the last 168 hours. Recent Labs  Lab 01/05/18 1124  AMMONIA 17   Coagulation Profile: No results for input(s): INR, PROTIME in the last 168 hours. Cardiac Enzymes: No results for input(s): CKTOTAL, CKMB, CKMBINDEX, TROPONINI in the last 168 hours. BNP (last 3 results) No results for input(s): PROBNP in the last 8760 hours. HbA1C: No results for input(s): HGBA1C in the last 72 hours. CBG: No results for input(s): GLUCAP in the last 168 hours. Lipid Profile: No results for input(s): CHOL, HDL, LDLCALC, TRIG, CHOLHDL, LDLDIRECT in the last 72 hours. Thyroid Function Tests: No results for input(s): TSH, T4TOTAL, FREET4, T3FREE, THYROIDAB in the last 72 hours. Anemia Panel: No results for input(s): VITAMINB12, FOLATE, FERRITIN, TIBC, IRON, RETICCTPCT in the last 72 hours. Sepsis Labs: Recent Labs  Lab 01/05/18 0031  LATICACIDVEN 0.68    Recent Results (from the past 240 hour(s))  Blood Culture (routine x 2)     Status: None (Preliminary result)   Collection Time: 01/04/18 11:19 PM  Result Value Ref Range Status   Specimen Description BLOOD LEFT WRIST  Final   Special Requests   Final    BOTTLES DRAWN AEROBIC AND ANAEROBIC Blood Culture results may not be optimal due to an excessive volume of blood received in culture bottles DRAWN BY RN   Culture   Final    NO  GROWTH 2 DAYS Performed at [facility], [address] [place] [address] [place], [place] [address]    Report Status PENDING  Incomplete  Blood Culture (routine x 2)     Status: None (Preliminary result)   Collection Time: 01/04/18 11:26 PM  Result Value Ref Range Status   Specimen Description BLOOD LEFT HAND  Final   Special Requests   Final    BOTTLES DRAWN AEROBIC AND ANAEROBIC Blood Culture adequate volume DRAWN BY RN   Culture   Final    NO GROWTH 2 DAYS Performed at [facility], [address], [place], [place] [address]    Report Status PENDING  Incomplete  Urine culture     Status: Abnormal (Preliminary result)   Collection Time: 01/05/18 12:21 AM  Result Value  Ref Range Status   Specimen Description   Final    URINE, CATHETERIZED Performed at [facility], [address] [place], [place] [address]    Special Requests   Final    URINE, CATHETERIZED Performed at [facility], [address] [place], [place] [address]    Culture >=100,000 COLONIES/mL ESCHERICHIA COLI (A)  Final   Report Status PENDING  Incomplete  MRSA PCR Screening     Status: None   Collection Time: 01/05/18  2:10 AM  Result Value Ref Range Status   MRSA by PCR NEGATIVE NEGATIVE Final    Comment:        The GeneXpert MRSA Assay (FDA approved for NASAL specimens only), is one component of a comprehensive MRSA colonization surveillance program. It is not intended to diagnose MRSA infection nor to guide or monitor treatment for MRSA infections. Performed at [facility], [address], [place], [place] [address]          Radiology Studies: Dg Chest [facility] 1 View  Result Date: 01/06/2018 CLINICAL DATA:  Fever.  Atrial fibrillation. EXAM: PORTABLE CHEST 1 VIEW COMPARISON:  01/04/2018 FINDINGS: Heart size is normal. Chronic aortic atherosclerosis seen. The right lung remains clear except for minimal linear atelectasis at base. There has been resolution of left suprahilar atelectasis on the left. Slight worsening  of atelectasis left lower lobe. IMPRESSION: Improved aeration of the left upper lobe. Slight worsening of atelectasis in both lower lobes. Heart failure. Electronically Signed   By: Paulina Fusi M.D.   On: 01/06/2018 07:42   Dg Chest Port 1 View  Result Date: 01/04/2018 CLINICAL DATA:  Fever and altered level of consciousness. EXAM: PORTABLE CHEST 1 VIEW COMPARISON:  None. FINDINGS: The patient is tilted and rotated to the left. Heart size is top-normal with tortuous atherosclerotic aorta. No overt pulmonary edema, effusion or pneumothorax. Summation of overlapping ribs and pulmonary vessels in the left upper lobe are believed to account for the increased opacity seen in the left upper lobe. Pneumonia is believed less likely. IMPRESSION: Aortic atherosclerosis with borderline cardiomegaly. No active pulmonary disease. Electronically Signed   By: Tollie Eth M.D.   On: 01/04/2018 23:48   Dg Abd Portable 2 Views  Result Date: 01/05/2018 CLINICAL DATA:  Fever and altered mental status. History of volvulus. EXAM: PORTABLE ABDOMEN - 2 VIEW COMPARISON:  Radiographs and CT 02/12/2016 FINDINGS: Gas-filled distended tortuous sigmoid colon extending into the abdomen appears similar to prior radiograph. Moderate stool in the remainder the colon. No small bowel dilatation. No evidence of free air. Postsurgical change of both hips. The bones are under mineralized. IMPRESSION: Gaseous distension of tortuous sigmoid colon appears similar to prior. While radiograph findings are concerning for possible volvulus, CT at that time demonstrated tortuous sigmoid colon without evidence of volvulus. This may be patient's baseline, and similar colonic findings were seen on pelvic radiograph 05/02/2017. Electronically Signed   By: Rubye Oaks M.D.   On: 01/05/2018 01:46        Scheduled Meds: . amLODipine  5 mg Oral Daily  . aspirin EC  81 mg Oral Daily  . clonazePAM  0.25 mg Oral BID  . enoxaparin (LOVENOX) injection   40 mg Subcutaneous Q24H  . Eslicarbazepine Acetate  1 tablet Oral Daily  . levETIRAcetam  500 mg Oral BID  . magnesium oxide  200 mg Oral Daily  . pantoprazole  40 mg Oral Daily  . polyethylene glycol  17 g Oral Daily  . sertraline  25 mg Oral Daily  . cyanocobalamin  2,000 mcg Oral Daily   Continuous Infusions: . 0.9 % NaCl with KCl 40 mEq / L 75 mL/hr (01/06/18 0010)  . piperacillin-tazobactam (ZOSYN)  IV 3.375 g (01/06/18 1412)     LOS: 1 day    Time spent: 30mins    Erick BlinksJehanzeb Memon, MD [facility] Pager [phone]  If 7PM-7AM, please contact night-coverage [email or web address] Password [email or web address] 01/06/2018, 4:36 PM

## 2018-01-07 LAB — BASIC METABOLIC PANEL
Anion gap: 6 (ref 5–15)
BUN: 21 mg/dL — AB (ref 6–20)
CALCIUM: 8.8 mg/dL — AB (ref 8.9–10.3)
CO2: 27 mmol/L (ref 22–32)
CREATININE: 0.64 mg/dL (ref 0.44–1.00)
Chloride: 112 mmol/L — ABNORMAL HIGH (ref 101–111)
GFR calc Af Amer: >60 mL/min (ref >60)
Glucose, Bld: 83 mg/dL (ref 65–99)
Potassium: 3.7 mmol/L (ref 3.5–5.1)
Sodium: 145 mmol/L (ref 135–145)

## 2018-01-07 LAB — CBC WITH DIFFERENTIAL/PLATELET
Basophils Absolute: 0 10*3/uL (ref 0.0–0.1)
Basophils Relative: 0 %
EOS ABS: 0.2 10*3/uL (ref 0.0–0.7)
EOS PCT: 4 %
HCT: 33.9 % — ABNORMAL LOW (ref 36.0–46.0)
Hemoglobin: 10.7 g/dL — ABNORMAL LOW (ref 12.0–15.0)
LYMPHS ABS: 1.4 10*3/uL (ref 0.7–4.0)
Lymphocytes Relative: 31 %
MCH: 30.7 pg (ref 26.0–34.0)
MCHC: 31.6 g/dL (ref 30.0–36.0)
MCV: 97.4 fL (ref 78.0–100.0)
MONO ABS: 0.5 10*3/uL (ref 0.1–1.0)
MONOS PCT: 10 %
Neutro Abs: 2.4 10*3/uL (ref 1.7–7.7)
Neutrophils Relative %: 55 %
PLATELETS: 163 10*3/uL (ref 150–400)
RBC: 3.48 MIL/uL — AB (ref 3.87–5.11)
RDW: 12.8 % (ref 11.5–15.5)
WBC: 4.5 10*3/uL (ref 4.0–10.5)

## 2018-01-07 LAB — URINE CULTURE: Culture: >=100000 — AB

## 2018-01-07 MED ORDER — LEVETIRACETAM IN NACL 500 MG/100ML IV SOLN
500.0000 mg | Freq: Two times a day (BID) | INTRAVENOUS | Status: DC
  Administered 2018-01-07 – 2018-01-08 (×2): 500 mg via INTRAVENOUS
  Filled 2018-01-07 (×2): qty 100

## 2018-01-07 MED ORDER — LORAZEPAM 2 MG/ML IJ SOLN
0.5000 mg | Freq: Once | INTRAMUSCULAR | Status: AC
  Administered 2018-01-07: 0.5 mg via INTRAVENOUS
  Filled 2018-01-07: qty 1

## 2018-01-07 NOTE — Evaluation (Signed)
Clinical/Bedside Swallow Evaluation Patient Details  Name: Joan Mathis MRN: [id number] Date of Birth: [birth year]  Today's Date: 01/07/2018 Time: SLP Start Time (ACUTE ONLY): 1400 SLP Stop Time (ACUTE ONLY): 1425 SLP Time Calculation (min) (ACUTE ONLY): 25 min  Past Medical History:  Past Medical History:  Diagnosis Date  . Acid reflux   . Arthritis   . Atherosclerotic cardiovascular disease   . Atrial fibrillation (HCC)   . Blindness    legal blindness,left eye,histor  . Dementia   . Dysphagia   . FH: dementia   . H/O: stroke    ocular stroke,   . Hypercholesteremia   . Hyperlipemia   . Hypertension   . Postmenopausal   . Right kidney mass 02/14/2016   Consistent with a renal cyst per ultrasound   . Seizures (HCC)   . Stroke [facility])    tia per nursing facilities  . Trochanteric bursitis    Past Surgical History: History reviewed. No pertinent surgical history. HPI:  84 year old female with a history of dementia who is a resident of a nursing facility, admitted to the hospital with altered mental status and sepsis.  Found to have evidence of urinary tract infection and possible aspiration pneumonia.  Started on intravenous antibiotics.  She remains lethargic at this time.  Hemodynamics are stable.  Continue IV antibiotics for now.   Assessment / Plan / Recommendation Clinical Impression  Pt currently presents with cognitive based dysphagia in setting of dementia with acute mental status changes characterized by lethargy, reduced awareness of bolus, limited attempts to manipulate purees, suspected delay in swallow initiation with immediate and delayed strong coughing episodes consistent across all po attempts. Recommend NPO with SLP to check again tomorrow. Continue oral care.   SLP Visit Diagnosis: Dysphagia, unspecified (R13.10)    Aspiration Risk  Severe aspiration risk;Risk for inadequate nutrition/hydration    Diet Recommendation NPO        Other  Recommendations  Oral Care Recommendations: Oral care QID;Staff/trained caregiver to provide oral care   Follow up Recommendations 24 hour supervision/assistance;Skilled Nursing facility      Frequency and Duration min 2x/week  1 week       Prognosis Prognosis for Safe Diet Advancement: Guarded Barriers to Reach Goals: Cognitive deficits;Behavior      Swallow Study   General Date of Onset: 01/04/18 HPI: 84 year old female with a history of dementia who is a resident of a nursing facility, admitted to the hospital with altered mental status and sepsis.  Found to have evidence of urinary tract infection and possible aspiration pneumonia.  Started on intravenous antibiotics.  She remains lethargic at this time.  Hemodynamics are stable.  Continue IV antibiotics for now. Type of Study: Bedside Swallow Evaluation Diet Prior to this Study: NPO Temperature Spikes Noted: No Respiratory Status: Nasal cannula History of Recent Intubation: No Behavior/Cognition: Lethargic/Drowsy Oral Cavity Assessment: (mild lingual coating) Oral Care Completed by SLP: Yes Oral Cavity - Dentition: Edentulous Vision: Impaired for self-feeding Self-Feeding Abilities: Total assist Patient Positioning: Upright in bed Baseline Vocal Quality: Not observed Volitional Cough: Congested;Strong Volitional Swallow: Unable to elicit    Oral/Motor/Sensory Function Overall Oral Motor/Sensory Function: Generalized oral weakness Facial ROM: Reduced left Facial Symmetry: Within Functional Limits Facial Strength: Reduced right;Reduced left Facial Sensation: Within Functional Limits Lingual ROM: Reduced right;Reduced left Lingual Strength: Reduced Lingual Sensation: Reduced Mandible: Impaired   Ice Chips Ice chips: Impaired Presentation: Spoon Oral Phase Impairments: Reduced labial seal;Reduced lingual movement/coordination;Impaired mastication;Poor awareness of bolus   Thin Liquid  Thin Liquid: Impaired Presentation: Cup;Straw Oral  Phase Impairments: Reduced lingual movement/coordination;Reduced labial seal;Poor awareness of bolus Pharyngeal  Phase Impairments: Suspected delayed Swallow;Cough - Immediate    Nectar Thick Nectar Thick Liquid: Impaired Presentation: Cup;Spoon;Straw Oral Phase Impairments: Reduced lingual movement/coordination;Poor awareness of bolus Oral phase functional implications: Oral holding Pharyngeal Phase Impairments: Suspected delayed Swallow;Cough - Delayed   Honey Thick Honey Thick Liquid: Not tested   Puree Puree: Impaired Presentation: Spoon Oral Phase Impairments: Reduced lingual movement/coordination;Reduced labial seal;Poor awareness of bolus Oral Phase Functional Implications: Left anterior spillage;Left lateral sulci pocketing;Oral residue;Oral holding Pharyngeal Phase Impairments: Suspected delayed Swallow;Cough - Delayed   Solid   Thank you,  Havery MorosDabney Porter, CCC-SLP [phone]    Solid: Not tested        PORTER,DABNEY 01/07/2018,3:21 PM

## 2018-01-07 NOTE — Progress Notes (Signed)
PROGRESS NOTE    Joan Mathis  ZOX:[id number] DOB: 09-02-1925 DOA: 01/04/2018 PCP: Bernerd Limbo, MD    Brief Narrative:  84 year old female with a history of dementia who is a resident of a nursing facility, admitted to the hospital with altered mental status and sepsis.  Found to have evidence of urinary tract infection and possible aspiration pneumonia.  Started on intravenous antibiotics.  She remains lethargic at this time.  Hemodynamics are stable.  Continue IV antibiotics for now.  Assessment & Plan:   Principal Problem:   Sepsis due to undetermined organism [facility]) Active Problems:   Essential hypertension, benign   CAD, NATIVE VESSEL   Hyperlipidemia   Dementia without behavioral disturbance   Altered mental status   Hypokalemia   Chronic atrial fibrillation (HCC)   Seizure disorder (HCC)   Malnutrition of moderate degree   Acute encephalopathy   Acute lower UTI   1. Sepsis.  Etiologies include UTI versus aspiration pneumonia.  Hemodynamically she is stable.  She was having fevers at the time of admission, but has been afebrile since admission.  Blood cultures have shown no growth.  Urine culture showing E. coli.  Continue IV antibiotics.   2. Urinary tract infection.  Positive for E. coli.  Currently on IV antibiotics.  Follow-up with your sensitivities. 3. Aspiration pneumonia.  Patient has altered mental status and appears to be very rhonchorous.  Continue on IV antibiotics for now.  MRSA PCR is negative.  Vancomycin has been discontinued.  Continue on Zosyn for now.  Seen by speech therapy and recommendations are for continued n.p.o. for now. 4. Acute encephalopathy.  Superimposed on baseline dementia.  Likely exacerbated by underlying infection and acute illness.  ABG and ammonia unrevealing.  Continues to have some lethargy and appears to be dysarthric.  Check MRI to rule out underlying CVA. 5. Hypertension.  Blood pressure currently stable.  Continue to monitor.   She is on amlodipine. 6. Seizure disorder.  She is chronically on Keppra.  EEG does not show any epileptic form discharges.  Continue Keppra. 7. Chronic atrial fibrillation.  Currently in sinus rhythm.  Not on chronic anticoagulation.  This can be readdressed when she stabilizes.  Heart rate is currently controlled. 8. Urinary retention. Noted by staff that she did not have any urine output. Bladder scan showed >900cc. Foley catheter placed   DVT prophylaxis: lovenox Code Status: DNR  Family Communication: discussed with daughter on 6/10  Disposition Plan: return to SNF on discharge   Consultants:     Procedures:   EEG: This awake and drowsy EEG is abnormal due to moderate diffuse background slowing.  Antimicrobials:   Vancomycin 6/9>6/11  Zosyn 6/9>    Subjective: Does not follow commands, denies pain  Objective: Vitals:   01/07/18 1500 01/07/18 1600 01/07/18 1607 01/07/18 1700  BP: (!) 118/58 124/64  136/73  Pulse: 66 67  63  Resp: 12 15  14   Temp:   97.9 F (36.6 C)   TempSrc:   Oral   SpO2: 97%   97%  Weight:      Height:        Intake/Output Summary (Last 24 hours) at 01/07/2018 1830 Last data filed at 01/07/2018 1808 Gross per 24 hour  Intake 1725.42 ml  Output 900 ml  Net 825.42 ml   Filed Weights   01/05/18 0745 01/06/18 0500  Weight: 56.2 kg (123 lb 14.4 oz) 56.9 kg (125 lb 7.1 oz)    Examination:  General exam:  sitting up in bed, somnolent but wakes up to voice Respiratory system: bilateral rhonchi. Respiratory effort normal. Cardiovascular system: S1 & S2 heard, RRR. No JVD, murmurs, rubs, gallops or clicks. . Gastrointestinal system: Abdomen is nondistended, soft and nontender. No organomegaly or masses felt. Normal bowel sounds heard. Central nervous system: dysarthric, limited exam due to mental status Extremities: no edema bilaterally Skin: No rashes, lesions or ulcers Psychiatry: somnolent but wakes up to voice and says yes and  no    Data Reviewed: I have personally reviewed following labs and imaging studies  CBC: Recent Labs  Lab 01/04/18 2318 01/05/18 0508 01/06/18 0543 01/07/18 0426  WBC 5.2 6.3 5.2 4.5  NEUTROABS 3.5 3.8 2.9 2.4  HGB 11.5* 11.3* 11.6* 10.7*  HCT 35.4* 35.3* 36.3 33.9*  MCV 95.9 96.7 96.5 97.4  PLT 189 179 172 163   Basic Metabolic Panel: Recent Labs  Lab 01/04/18 2318 01/05/18 0508 01/06/18 0543 01/07/18 0426  NA 143 144 144 145  K 2.4* 3.5 3.8 3.7  CL 104 109 109 112*  CO2 31 31 29 27   GLUCOSE 97 87 88 83  BUN 24* 19 21* 21*  CREATININE 0.85 0.82 0.63 0.64  CALCIUM 8.7* 8.2* 8.8* 8.8*  MG 1.9  --   --   --   PHOS  --  3.0  --   --    GFR: Estimated Creatinine Clearance: 42.8 mL/min (by C-G formula based on SCr of 0.64 mg/dL). Liver Function Tests: Recent Labs  Lab 01/04/18 2318 01/05/18 0508  AST 20 20  ALT 15 14  ALKPHOS 69 71  BILITOT 0.8 0.9  PROT 6.7 6.3*  ALBUMIN 3.3* 2.9*   No results for input(s): LIPASE, AMYLASE in the last 168 hours. Recent Labs  Lab 01/05/18 1124  AMMONIA 17   Coagulation Profile: No results for input(s): INR, PROTIME in the last 168 hours. Cardiac Enzymes: No results for input(s): CKTOTAL, CKMB, CKMBINDEX, TROPONINI in the last 168 hours. BNP (last 3 results) No results for input(s): PROBNP in the last 8760 hours. HbA1C: No results for input(s): HGBA1C in the last 72 hours. CBG: No results for input(s): GLUCAP in the last 168 hours. Lipid Profile: No results for input(s): CHOL, HDL, LDLCALC, TRIG, CHOLHDL, LDLDIRECT in the last 72 hours. Thyroid Function Tests: No results for input(s): TSH, T4TOTAL, FREET4, T3FREE, THYROIDAB in the last 72 hours. Anemia Panel: No results for input(s): VITAMINB12, FOLATE, FERRITIN, TIBC, IRON, RETICCTPCT in the last 72 hours. Sepsis Labs: Recent Labs  Lab 01/05/18 0031  LATICACIDVEN 0.68    Recent Results (from the past 240 hour(s))  Blood Culture (routine x 2)     Status:  None (Preliminary result)   Collection Time: 01/04/18 11:19 PM  Result Value Ref Range Status   Specimen Description BLOOD LEFT WRIST  Final   Special Requests   Final    BOTTLES DRAWN AEROBIC AND ANAEROBIC Blood Culture results may not be optimal due to an excessive volume of blood received in culture bottles DRAWN BY RN   Culture   Final    NO GROWTH 3 DAYS Performed at [facility], [address] [place], [place] [address]    Report Status PENDING  Incomplete  Blood Culture (routine x 2)     Status: None (Preliminary result)   Collection Time: 01/04/18 11:26 PM  Result Value Ref Range Status   Specimen Description BLOOD LEFT HAND  Final   Special Requests   Final    BOTTLES DRAWN  AEROBIC AND ANAEROBIC Blood Culture adequate volume DRAWN BY RN   Culture   Final    NO GROWTH 3 DAYS Performed at [facility], [address] [place], [place] [address]    Report Status PENDING  Incomplete  Urine culture     Status: Abnormal   Collection Time: 01/05/18 12:21 AM  Result Value Ref Range Status   Specimen Description   Final    URINE, CATHETERIZED Performed at [facility], [address] [place], [place] [address]    Special Requests   Final    URINE, CATHETERIZED Performed at [facility], [address] [place], [place] [address]    Culture >=100,000 COLONIES/mL ESCHERICHIA COLI (A)  Final   Report Status 01/07/2018 FINAL  Final   Organism ID, Bacteria ESCHERICHIA COLI (A)  Final      Susceptibility   Escherichia coli - MIC*    AMPICILLIN 8 SENSITIVE Sensitive     CEFAZOLIN <=4 SENSITIVE Sensitive     CEFTRIAXONE <=1 SENSITIVE Sensitive     CIPROFLOXACIN >=4 RESISTANT Resistant     GENTAMICIN <=1 SENSITIVE Sensitive     IMIPENEM <=0.25 SENSITIVE Sensitive     NITROFURANTOIN 128 RESISTANT Resistant     TRIMETH/SULFA >=320 RESISTANT Resistant     AMPICILLIN/SULBACTAM <=2 SENSITIVE Sensitive     PIP/TAZO <=4 SENSITIVE Sensitive     Extended ESBL NEGATIVE Sensitive     *  >=100,000 COLONIES/mL ESCHERICHIA COLI  MRSA PCR Screening     Status: None   Collection Time: 01/05/18  2:10 AM  Result Value Ref Range Status   MRSA by PCR NEGATIVE NEGATIVE Final    Comment:        The GeneXpert MRSA Assay (FDA approved for NASAL specimens only), is one component of a comprehensive MRSA colonization surveillance program. It is not intended to diagnose MRSA infection nor to guide or monitor treatment for MRSA infections. Performed at [facility], [address] [place], [place] [address]          Radiology Studies: Dg Chest [facility] Incort 1 View  Result Date: 01/06/2018 CLINICAL DATA:  Fever.  Atrial fibrillation. EXAM: PORTABLE CHEST 1 VIEW COMPARISON:  01/04/2018 FINDINGS: Heart size is normal. Chronic aortic atherosclerosis seen. The right lung remains clear except for minimal linear atelectasis at base. There has been resolution of left suprahilar atelectasis on the left. Slight worsening of atelectasis left lower lobe. IMPRESSION: Improved aeration of the left upper lobe. Slight worsening of atelectasis in both lower lobes. Heart failure. Electronically Signed   By: Paulina FusiMark  Shogry M.D.   On: 01/06/2018 07:42        Scheduled Meds: . amLODipine  5 mg Oral Daily  . aspirin EC  81 mg Oral Daily  . clonazePAM  0.25 mg Oral BID  . enoxaparin (LOVENOX) injection  40 mg Subcutaneous Q24H  . Eslicarbazepine Acetate  1 tablet Oral Daily  . magnesium oxide  200 mg Oral Daily  . mouth rinse  15 mL Mouth Rinse BID  . pantoprazole  40 mg Oral Daily  . polyethylene glycol  17 g Oral Daily  . sertraline  25 mg Oral Daily  . cyanocobalamin  2,000 mcg Oral Daily   Continuous Infusions: . 0.9 % NaCl with KCl 40 mEq / L 75 mL/hr (01/07/18 0912)  . levETIRAcetam Stopped (01/07/18 1600)  . piperacillin-tazobactam (ZOSYN)  IV Stopped (01/07/18 1809)     LOS: 2 days    Time spent: 30mins    Gavrielle Streck  Kerry Hough, MD [facility] Pager [phone]  If 7PM-7AM,  please contact night-coverage [email or web address] Password [facility] 01/07/2018, 6:30 PM

## 2018-01-07 NOTE — Clinical Social Work Note (Signed)
Clinical Social Work Assessment  Patient Details  Name: Joan Mathis MRN: [id number] Date of Birth: Nov 05, 1925  Date of referral:  01/07/18               Reason for consult:  Discharge Planning                Permission sought to share information with:    Permission granted to share information::     Name::        Agency::     Relationship::     Contact Information:     Housing/Transportation Living arrangements for the past 2 months:  Skilled Nursing Facility Source of Information:  Facility Patient Interpreter Needed:  None Criminal Activity/Legal Involvement Pertinent to Current Situation/Hospitalization:  No - Comment as needed Significant Relationships:  Adult Children Lives with:  Facility Resident Do you feel safe going back to the place where you live?    Need for family participation in patient care:  No (Coment)  Care giving concerns: [facility] at [facility].   Social Worker assessment / plan: Pt is an 84 year old female admitted from [facility]. Spoke with Debbie from [facility] today to update. Per Eunice Blaseebbie, pt is in their long term care unit and they will accept her back upon dc. Pt needs quite a lot of care at the facility. Family members are supportive.  Employment status:  Retired Health and safety inspectornsurance information:  Medicare PT Recommendations:  Not assessed at this time Information / Referral to community resources:     Patient/Family's Response to care: Pt with dementia. She is accepting of care.  Patient/Family's Understanding of and Emotional Response to Diagnosis, Current Treatment, and Prognosis: Pt has dementia so she has limited ability to understand diagnosis/treatment recommendations. No emotional distress identified.  Emotional Assessment Appearance:  Appears stated age Attitude/Demeanor/Rapport:    Affect (typically observed):  Pleasant Orientation:  Oriented to Self Alcohol / Substance use:  Not Applicable Psych involvement (Current and /or in the community):  No  (Comment)  Discharge Needs  Concerns to be addressed:  Discharge Planning Concerns Readmission within the last 30 days:  No Current discharge risk:  None Barriers to Discharge:  No Barriers Identified   Elliot GaultKathleen Franciszek Platten, LCSW 01/07/2018, 12:43 PM

## 2018-01-07 NOTE — [place] FL2 (Signed)
[place] MEDICAID FL2 LEVEL OF CARE SCREENING TOOL     IDENTIFICATION  Patient Name: Joan Mathis Birthdate: July 26, 1926 Sex: female Admission Date (Current Location): 01/04/2018  [facility] and [place] Number:  [employer] and Address:  [facility],  [address] [place] [address]      Provider Number: [id number]  Attending Physician Name and Address:  Erick Blinks, MD  Relative Name and Phone Number:  Frutoso Chase (daughter) [phone]    Current Level of Care: Hospital Recommended Level of Care: Skilled Nursing Facility Prior Approval Number:    Date Approved/Denied:   PASRR Number:    Discharge Plan: SNF    Current Diagnoses: Patient Active Problem List   Diagnosis Date Noted  . Malnutrition of moderate degree 01/06/2018  . Acute encephalopathy 01/06/2018  . Acute lower UTI 01/06/2018  . Sepsis due to undetermined organism (HCC) 01/05/2018  . Generalized weakness 03/24/2017  . Fecal impaction (HCC) 02/14/2016  . Urinary tract infection, site not specified 02/14/2016  . Hypokalemia 02/14/2016  . Anemia due to other cause 02/14/2016  . Chronic atrial fibrillation (HCC) 02/14/2016  . Thrombocytopenia (HCC) 02/14/2016  . Seizure disorder (HCC) 02/14/2016  . Right kidney mass 02/14/2016  . Dehydration   . Sigmoid volvulus (HCC) 02/12/2016  . Hypotension 02/12/2016  . Abdominal pain 02/12/2016  . Altered mental status 02/12/2016  . Hyperlipidemia 04/03/2015  . Dementia without behavioral disturbance 04/03/2015  . Dementia with behavioral disturbance   . Atrial fibrillation with RVR (HCC)   . TIA (transient ischemic attack) 04/02/2015  . HYPERLIPIDEMIA-MIXED 05/10/2010  . Essential hypertension, benign 05/10/2010  . CAD, NATIVE VESSEL 05/10/2010    Orientation RESPIRATION BLADDER Height & Weight     Self    Incontinent Weight: 125 lb 7.1 oz (56.9 kg) Height:  5\' 6"  (167.6 cm)  BEHAVIORAL SYMPTOMS/MOOD NEUROLOGICAL BOWEL  NUTRITION STATUS      Incontinent Diet(see dc summary)  AMBULATORY STATUS COMMUNICATION OF NEEDS Skin   Extensive Assist Verbally Normal                       Personal Care Assistance Level of Assistance  Bathing, Feeding, Dressing Bathing Assistance: Maximum assistance Feeding assistance: Maximum assistance Dressing Assistance: Maximum assistance     Functional Limitations Info  Sight, Hearing, Speech Sight Info: Impaired Hearing Info: Adequate Speech Info: Adequate    SPECIAL CARE FACTORS FREQUENCY                       Contractures Contractures Info: Not present    Additional Factors Info  Code Status, Allergies, Psychotropic Code Status Info: DNR Allergies Info: No Known Allergies Psychotropic Info: Klonopin, Zoloft         Current Medications (01/07/2018):  This is the current hospital active medication list Current Facility-Administered Medications  Medication Dose Route Frequency Provider Last Rate Last Dose  . 0.9 % NaCl with KCl 40 mEq / L  infusion   Intravenous Continuous Erick Blinks, MD 75 mL/hr at 01/07/18 0912 75 mL/hr at 01/07/18 0912  . acetaminophen (TYLENOL) tablet 650 mg  650 mg Oral Q6H PRN Bobette Mo, MD   650 mg at 01/05/18 2016   Or  . acetaminophen (TYLENOL) suppository 650 mg  650 mg Rectal Q6H PRN Bobette Mo, MD      . amLODipine [facility]) tablet 5 mg  5 mg Oral Daily Bobette Mo, MD   5 mg at 01/06/18  [id number]  . aspirin EC tablet 81 mg  81 mg Oral Daily Bobette Mortiz, David Manuel, MD   81 mg at 01/06/18 0912  . clonazePAM (KLONOPIN) tablet 0.25 mg  0.25 mg Oral BID Bobette Mortiz, David Manuel, MD   0.25 mg at 01/06/18 2118  . enoxaparin (LOVENOX) injection 40 mg  40 mg Subcutaneous Q24H Bobette Mortiz, David Manuel, MD   40 mg at 01/07/18 0505  . Eslicarbazepine Acetate TABS 1 tablet  1 tablet Oral Daily Bobette Mortiz, David Manuel, MD   1 tablet at 01/06/18 0920  . hydrALAZINE (APRESOLINE) injection 10 mg  10 mg Intravenous Q4H PRN  Erick BlinksMemon, Jehanzeb, MD   10 mg at 01/07/18 0605  . levETIRAcetam (KEPPRA) tablet 500 mg  500 mg Oral BID Erick BlinksMemon, Jehanzeb, MD   500 mg at 01/06/18 2118  . magnesium oxide (MAG-OX) tablet 200 mg  200 mg Oral Daily Bobette Mortiz, David Manuel, MD   200 mg at 01/06/18 0912  . MEDLINE mouth rinse  15 mL Mouth Rinse BID Erick BlinksMemon, Jehanzeb, MD   15 mL at 01/07/18 1032  . ondansetron (ZOFRAN) tablet 4 mg  4 mg Oral Q6H PRN Bobette Mortiz, David Manuel, MD       Or  . ondansetron [facility](ZOFRAN) injection 4 mg  4 mg Intravenous Q6H PRN Bobette Mortiz, David Manuel, MD      . pantoprazole (PROTONIX) EC tablet 40 mg  40 mg Oral Daily Bobette Mortiz, David Manuel, MD   40 mg at 01/06/18 0912  . piperacillin-tazobactam (ZOSYN) IVPB 3.375 g  3.375 g Intravenous Q8H Erick BlinksMemon, Jehanzeb, MD   Stopped at 01/07/18 0908  . polyethylene glycol (MIRALAX / GLYCOLAX) packet 17 g  17 g Oral Daily Bobette Mortiz, David Manuel, MD      . sertraline (ZOLOFT) tablet 25 mg  25 mg Oral Daily Bobette Mortiz, David Manuel, MD   25 mg at 01/06/18 0914  . vitamin B-12 (CYANOCOBALAMIN) tablet 2,000 mcg  2,000 mcg Oral Daily Bobette Mortiz, David Manuel, MD   2,000 mcg at 01/06/18 [id number]     Discharge Medications: Please see discharge summary for a list of discharge medications.  Relevant Imaging Results:  Relevant Lab Results:   Additional Information    Elliot GaultKathleen Elidia Bonenfant, LCSW

## 2018-01-07 NOTE — Care Management Important Message (Signed)
Important Message  Patient Details  Name: Ky BarbanRuth Beadles MRN: [id number] Date of Birth: 1925/12/03   Medicare Important Message Given:  Yes    Renie OraHawkins, Lilian Fuhs Smith 01/07/2018, 11:57 AM

## 2018-01-08 ENCOUNTER — Encounter (HOSPITAL_COMMUNITY): Payer: Self-pay | Admitting: Primary Care

## 2018-01-08 ENCOUNTER — Inpatient Hospital Stay (HOSPITAL_COMMUNITY): Payer: Medicare Other

## 2018-01-08 DIAGNOSIS — R1310 Dysphagia, unspecified: Secondary | ICD-10-CM

## 2018-01-08 DIAGNOSIS — I6389 Other cerebral infarction: Secondary | ICD-10-CM | POA: Diagnosis present

## 2018-01-08 DIAGNOSIS — Z7189 Other specified counseling: Secondary | ICD-10-CM

## 2018-01-08 DIAGNOSIS — J690 Pneumonitis due to inhalation of food and vomit: Secondary | ICD-10-CM

## 2018-01-08 DIAGNOSIS — Z515 Encounter for palliative care: Secondary | ICD-10-CM

## 2018-01-08 LAB — CBC WITH DIFFERENTIAL/PLATELET
Basophils Absolute: 0 10*3/uL (ref 0.0–0.1)
Basophils Relative: 0 %
EOS ABS: 0.1 10*3/uL (ref 0.0–0.7)
EOS PCT: 3 %
HCT: 32.3 % — ABNORMAL LOW (ref 36.0–46.0)
Hemoglobin: 10.3 g/dL — ABNORMAL LOW (ref 12.0–15.0)
LYMPHS ABS: 1.1 10*3/uL (ref 0.7–4.0)
LYMPHS PCT: 24 %
MCH: 31.2 pg (ref 26.0–34.0)
MCHC: 31.9 g/dL (ref 30.0–36.0)
MCV: 97.9 fL (ref 78.0–100.0)
MONOS PCT: 10 %
Monocytes Absolute: 0.4 10*3/uL (ref 0.1–1.0)
Neutro Abs: 2.8 10*3/uL (ref 1.7–7.7)
Neutrophils Relative %: 63 %
PLATELETS: 161 10*3/uL (ref 150–400)
RBC: 3.3 MIL/uL — ABNORMAL LOW (ref 3.87–5.11)
RDW: 13 % (ref 11.5–15.5)
WBC: 4.5 10*3/uL (ref 4.0–10.5)

## 2018-01-08 MED ORDER — MORPHINE SULFATE (PF) 2 MG/ML IV SOLN
2.0000 mg | INTRAVENOUS | Status: DC | PRN
  Administered 2018-01-08 (×2): 2 mg via INTRAVENOUS
  Filled 2018-01-08 (×2): qty 1

## 2018-01-08 MED ORDER — BISACODYL 10 MG RE SUPP: 10.0000 mg | Freq: Every day | RECTAL | Status: DC | PRN

## 2018-01-08 MED ORDER — MORPHINE SULFATE (CONCENTRATE) 10 MG/0.5ML PO SOLN
2.6000 mg | ORAL | 0 refills | Status: AC | PRN
Start: 2018-01-08 — End: ?

## 2018-01-08 MED ORDER — LORAZEPAM 0.5 MG PO TABS: 0.5000 mg | ORAL_TABLET | Freq: Three times a day (TID) | ORAL | 0 refills | Status: AC | PRN

## 2018-01-08 MED ORDER — POLYVINYL ALCOHOL 1.4 % OP SOLN: 2.0000 [drp] | OPHTHALMIC | Status: DC | PRN

## 2018-01-08 MED ORDER — LORAZEPAM 2 MG/ML IJ SOLN: 0.5000 mg | INTRAMUSCULAR | Status: DC | PRN

## 2018-01-08 MED ORDER — MORPHINE SULFATE (CONCENTRATE) 10 MG/0.5ML PO SOLN: 2.6000 mg | ORAL | Status: DC | PRN

## 2018-01-08 MED ORDER — ATROPINE SULFATE 1 % OP SOLN: 2.0000 [drp] | Freq: Three times a day (TID) | OPHTHALMIC | 12 refills | Status: AC | PRN

## 2018-01-08 MED ORDER — ATROPINE SULFATE 1 % OP SOLN: 2.0000 [drp] | Freq: Three times a day (TID) | OPHTHALMIC | Status: DC | PRN

## 2018-01-08 NOTE — Discharge Instructions (Signed)
Information on my medicine - ELIQUIS (apixaban)  This medication education was reviewed with me or my healthcare representative as part of my discharge preparation.  The pharmacist that spoke with me during my hospital stay was:  Layken Doenges C Lebert Lovern, RPH  Why was Eliquis prescribed for you? Eliquis was prescribed to treat blood clots that may have been found in the veins of your legs (deep vein thrombosis) or in your lungs (pulmonary embolism) and to reduce the risk of them occurring again.  What do You need to know about Eliquis ? The starting dose is 10 mg (two 5 mg tablets) taken TWICE daily for the FIRST SEVEN (7) DAYS, then  the dose is reduced to ONE 5 mg tablet taken TWICE daily.  Eliquis may be taken with or without food.   Try to take the dose about the same time in the morning and in the evening. If you have difficulty swallowing the tablet whole please discuss with your pharmacist how to take the medication safely.  Take Eliquis exactly as prescribed and DO NOT stop taking Eliquis without talking to the doctor who prescribed the medication.  Stopping may increase your risk of developing a new blood clot.  Refill your prescription before you run out.  After discharge, you should have regular check-up appointments with your healthcare provider that is prescribing your Eliquis.    What do you do if you miss a dose? If a dose of ELIQUIS is not taken at the scheduled time, take it as soon as possible on the same day and twice-daily administration should be resumed. The dose should not be doubled to make up for a missed dose.  Important Safety Information A possible side effect of Eliquis is bleeding. You should call your healthcare provider right away if you experience any of the following: ? Bleeding from an injury or your nose that does not stop. ? Unusual colored urine (red or dark brown) or unusual colored stools (red or black). ? Unusual bruising for unknown reasons. ? A  serious fall or if you hit your head (even if there is no bleeding).  Some medicines may interact with Eliquis and might increase your risk of bleeding or clotting while on Eliquis. To help avoid this, consult your healthcare provider or pharmacist prior to using any new prescription or non-prescription medications, including herbals, vitamins, non-steroidal anti-inflammatory drugs (NSAIDs) and supplements.  This website has more information on Eliquis (apixaban): http://www.eliquis.com/eliquis/home  

## 2018-01-08 NOTE — Clinical Social Work Note (Signed)
Patient [facility]. Discharge summary sent to facility. EMS transport arranged. Family is aware.  LCSW signing off.

## 2018-01-08 NOTE — Clinical Social Work Note (Signed)
Ky BarbanColeman, Esbeydi #[id number] (CSN: [id number]) (84 y.o. F) (Adm: 01/04/18)  AP-ICCUP-IC08-IC08-01  PCP   Bernerd LimboARIZA, FERNANDO ENRIQUE  Demographics  Comment    Last edited by  on at   Address: Home Phone: Work Phone: Sport and exercise psychologistMobile Phone:  [facility] [place]  [address]  [place] [address]   [phone] -- --  SSN: Insurance: Marital Status: Religion:  [id number] MEDICARE Widowed (none)  Patient Ethnicity & Race   Ethnic Group Patient Race  Not Hispanic or Latino White or Caucasian  Documents Filed to Patient   Power of Attorney Living Will Clinical Unknown Study Attachment Consent Form ABN Waiver After Visit Summary Lab Result Scan Code Status MyChart Status Advance Care Planning  Not on File Not on File Not on File Not on File Filed Not on File Filed Filed DNR [Updated on 01/05/18 0444] Code Exp Jump to the Activity  Admission Information   Attending Provider Admitting Provider Admission Type Admission Date/Time  Erick BlinksMemon, Jehanzeb, MD Bobette Mortiz, David Manuel, MD Emergency 01/04/18 2248  Discharge Date Hospital Service Auth/Cert Status Service Area   Internal Medicine Incomplete [facility]  Unit Room/Bed Admission Status   AP-ICCUP NURSING IC08/IC08-01 Admission (Confirmed)   Hospital Account   Name Acct ID Class Status Primary Coverage  Ky BarbanColeman, Sahmya [id number] Inpatient Open MEDICARE - MEDICARE PART A AND B      Guarantor Account (for Hospital Account [id number])   Name Relation to Pt Service Area Active? Acct Type  Ky Barbanoleman, Lilo Self CHSA Yes Personal/Family  Address Phone    [facility] [place] [address] [place], [place] [address] [phone])        Coverage Information (for Hospital Account [id number])   1. MEDICARE/MEDICARE PART A AND B   F/O Payor/Plan Precert #  MEDICARE/MEDICARE PART A AND B   Subscriber Subscriber #  Ky BarbanColeman, Amma [id number]  Address Phone  [address] [place], [place] [phone]   2. MEDICAID [place]/MEDICAID OF [place]   F/O Payor/Plan  Precert #  MEDICAID [place]/MEDICAID OF [place]   Subscriber Subscriber #  Ky BarbanColeman, Reneka [id number]  Address Phone  [address] [address] [place], [place] [address] [phone]

## 2018-01-08 NOTE — Progress Notes (Signed)
[id number] Patient returned from MRI, connected back to monitor, bed in lowest position/call bell within reach, fall mats on the floor.

## 2018-01-08 NOTE — Progress Notes (Signed)
[id number] [facility] notified and spoke with Graciella BeltonEmily Bates (office manager) who was made aware of new neurology consult for patient.

## 2018-01-08 NOTE — Discharge Summary (Signed)
Physician Discharge Summary  Joan Mathis ZOX:[id number] DOB: 06-Sep-1925 DOA: 01/04/2018  PCP: Bernerd Limbo, MD  Admit date: 01/04/2018 Discharge date: 01/08/2018  Admitted From: SNF Disposition:  Residential hospice  Recommendations for Outpatient Follow-up:  1. Patient will be discharged to residential hospice for end of life care  Discharge Condition: hospice CODE STATUS: DNR, comfort care Diet recommendation: ice chips for comfort  Brief/Interim Summary: 84 year old female with dementia who is a resident of a nursing facility, admitted to the hospital with acute change in mental status.  Found to have urinary tract infection, aspiration pneumonia as well as sepsis.  She was started on intravenous antibiotics and IV fluids.  Her hemodynamics stabilized.  Mental status to improve, but she was noted to be significantly dysarthric and had dysphagia.  She underwent MRI of the brain that showed right-sided pontine hemorrhagic infarct.  She failed swallow evaluation.  Case was discussed with her daughter and due to her poor baseline functional status and significant stroke findings, her long-term prognosis appeared to be poor.  Her daughter has elected to pursue comfort care.  She was seen by palliative care who will arrange for residential hospice transfer.  She will be allowed to have ice chips for comfort, but has severe dysphagia.  Patient will be transferred to residential hospice today for end-of-life care.  Discharge Diagnoses:  Principal Problem:   Sepsis due to undetermined organism [facility]) Active Problems:   Essential hypertension, benign   CAD, NATIVE VESSEL   Hyperlipidemia   Dementia without behavioral disturbance   Altered mental status   Hypokalemia   Chronic atrial fibrillation (HCC)   Seizure disorder (HCC)   Malnutrition of moderate degree   Acute encephalopathy   Acute lower UTI   Aspiration pneumonia (HCC)   Palliative care by specialist   Goals of care,  counseling/discussion   Encounter for hospice care discussion   Acute hemorrhagic infarction of brain [facility])   Dysphagia    Discharge Instructions  Discharge Instructions    Diet - low sodium heart healthy   Complete by:  As directed    Increase activity slowly   Complete by:  As directed      Allergies as of 01/08/2018   No Known Allergies     Medication List    STOP taking these medications   amLODipine 5 MG tablet Commonly known as:  NORVASC   aspirin 81 MG EC tablet   clonazePAM 0.5 MG tablet Commonly known as:  KLONOPIN   cyanocobalamin 2000 MCG tablet   Eslicarbazepine Acetate 400 MG Tabs   feeding supplement (PRO-STAT SUGAR FREE 64) Liqd   levETIRAcetam 500 MG tablet Commonly known as:  KEPPRA   Magnesium Oxide 200 MG Tabs   omeprazole 20 MG capsule Commonly known as:  PRILOSEC   polyethylene glycol packet Commonly known as:  MIRALAX / GLYCOLAX   sertraline 25 MG tablet Commonly known as:  ZOLOFT     TAKE these medications   atropine 1 % ophthalmic solution Place 2 drops under the tongue 3 (three) times daily as needed (Increased oral secretions).   LORazepam 0.5 MG tablet Commonly known as:  ATIVAN Place 1 tablet (0.5 mg total) under the tongue every 8 (eight) hours as needed for anxiety.   morphine CONCENTRATE 10 MG/0.5ML Soln concentrated solution Take 0.13-0.25 mLs (2.6-5 mg total) by mouth every 2 (two) hours as needed for moderate pain or shortness of breath (discomfort, increased RR).       No Known Allergies  Consultations:  Palliative care   Procedures/Studies: Mr Shirlee Latch NW Contrast  Addendum Date: 01/08/2018   ADDENDUM REPORT: 01/08/2018 08:50 ADDENDUM: #1 was discussed by telephone with Dr. Durward Mallard Parrish Daddario on 01/08/2018 at 0835 hours. Electronically Signed   By: Odessa Fleming M.D.   On: 01/08/2018 08:50   Result Date: 01/08/2018 CLINICAL DATA:  84 year old female with unexplained fever and altered level of consciousness. EXAM:  MRI HEAD WITHOUT CONTRAST MRA HEAD WITHOUT CONTRAST TECHNIQUE: Multiplanar, multiecho pulse sequences of the brain and surrounding structures were obtained without intravenous contrast. Angiographic images of the head were obtained using MRA technique without contrast. COMPARISON:  Head CT 04/03/2017. Brain MRI and intracranial MRA 04/03/2015. FINDINGS: MRI HEAD FINDINGS Brain: There is FLAIR and T2 hyperintensity throughout much of the brainstem, on the right side tracking into the right cerebral and cerebellar peduncles, and surrounding a dark T2 and T2*, heterogeneous mostly intermediate T1 intensity mass encompassing 12 x 17 x 14 millimeters (AP by transverse by CC). This is most compatible with acute hemorrhage (deoxy hemoglobin phase), and there are foci of restricted diffusion along the posterior margin of the bleed (series 3, image 76) in addition to susceptibility artifact from the blood on diffusion. Conspicuous T2 and FLAIR hyperintensity in the right medullary pyramid below the level of the hemorrhage (series 14, image 13) minimal mass effect on the 4th ventricle. No intraventricular extension. Basilar cisterns remain patent. Artifact on T2* imaging elsewhere, no other intracranial blood products identified. No other restricted diffusion. No midline shift,, supratentorial mass effect, ventriculomegaly. Supratentorial gray and white matter signal outside of the right cerebral peduncle is remarkable for chronic but increased bilateral cerebral white matter and deep gray matter nuclei T2 and FLAIR hyperintensity. No cortical encephalomalacia identified. Cervicomedullary junction and pituitary are within normal limits. Vascular: Major intracranial vascular flow voids are stable since 2016. Skull and upper cervical spine: Negative visible cervical spine. Normal bone marrow signal. Sinuses/Orbits: Stable and negative aside from chronic postoperative changes to the right globe. Other: Mild bilateral mastoid air  cell effusions are new. Negative nasopharynx. Scalp and face soft tissues appear negative. MRA HEAD FINDINGS Stable antegrade flow in the posterior circulation with mildly dominant distal left vertebral artery. Left PICA origin remains patent. Vertebrobasilar junction, basilar artery and AICA origins remain patent. No distal vertebral or basilar stenosis. The SCA and PCA origins remain patent and appear stable. Posterior communicating arteries are diminutive or absent. Bilateral PCA branches appear stable with suggestion of mild to moderate atherosclerotic irregularity. Stable antegrade flow in both ICA siphons. Right greater than left supraclinoid siphon irregularity compatible with atherosclerosis with mild to moderate stenosis, greater on the right, appears stable since 2016. Normal ophthalmic artery origins. Patent carotid termini. Normal MCA and ACA origins. Visible ACA branches are within normal limits. Left MCA M1 and visible left MCA branches are within normal limits. Right MCA M1 segment is tortuous and irregular with chronic distal M1 and right MCA bifurcation atherosclerosis and up to high-grade stenosis suspected. No definite right MCA branch occlusion. IMPRESSION: 1. Positive for acute hemorrhagic infarct in the right pons. Estimated blood products volume 1-2 mm. No intraventricular or extra-axial extension of blood. Brainstem edema tracking into the right cerebral and cerebellar peduncles. However, the ventricular system and basilar cisterns remain patent. 2. Intracranial MRA appears stable since 2016, no large vessel occlusion. Chronic bilateral distal ICA, bilateral PCA, the and right MCA atherosclerosis and stenosis. 3. Progressed cerebral white matter and deep gray matter T2 signal abnormality since  2016, favor small vessel disease related. Electronically Signed: By: Odessa Fleming M.D. On: 01/08/2018 08:19   Mr Brain Wo Contrast  Addendum Date: 01/08/2018   ADDENDUM REPORT: 01/08/2018 08:50 ADDENDUM:  #1 was discussed by telephone with Dr. Durward Mallard Obediah Welles on 01/08/2018 at 0835 hours. Electronically Signed   By: Odessa Fleming M.D.   On: 01/08/2018 08:50   Result Date: 01/08/2018 CLINICAL DATA:  84 year old female with unexplained fever and altered level of consciousness. EXAM: MRI HEAD WITHOUT CONTRAST MRA HEAD WITHOUT CONTRAST TECHNIQUE: Multiplanar, multiecho pulse sequences of the brain and surrounding structures were obtained without intravenous contrast. Angiographic images of the head were obtained using MRA technique without contrast. COMPARISON:  Head CT 04/03/2017. Brain MRI and intracranial MRA 04/03/2015. FINDINGS: MRI HEAD FINDINGS Brain: There is FLAIR and T2 hyperintensity throughout much of the brainstem, on the right side tracking into the right cerebral and cerebellar peduncles, and surrounding a dark T2 and T2*, heterogeneous mostly intermediate T1 intensity mass encompassing 12 x 17 x 14 millimeters (AP by transverse by CC). This is most compatible with acute hemorrhage (deoxy hemoglobin phase), and there are foci of restricted diffusion along the posterior margin of the bleed (series 3, image 76) in addition to susceptibility artifact from the blood on diffusion. Conspicuous T2 and FLAIR hyperintensity in the right medullary pyramid below the level of the hemorrhage (series 14, image 13) minimal mass effect on the 4th ventricle. No intraventricular extension. Basilar cisterns remain patent. Artifact on T2* imaging elsewhere, no other intracranial blood products identified. No other restricted diffusion. No midline shift,, supratentorial mass effect, ventriculomegaly. Supratentorial gray and white matter signal outside of the right cerebral peduncle is remarkable for chronic but increased bilateral cerebral white matter and deep gray matter nuclei T2 and FLAIR hyperintensity. No cortical encephalomalacia identified. Cervicomedullary junction and pituitary are within normal limits. Vascular: Major  intracranial vascular flow voids are stable since 2016. Skull and upper cervical spine: Negative visible cervical spine. Normal bone marrow signal. Sinuses/Orbits: Stable and negative aside from chronic postoperative changes to the right globe. Other: Mild bilateral mastoid air cell effusions are new. Negative nasopharynx. Scalp and face soft tissues appear negative. MRA HEAD FINDINGS Stable antegrade flow in the posterior circulation with mildly dominant distal left vertebral artery. Left PICA origin remains patent. Vertebrobasilar junction, basilar artery and AICA origins remain patent. No distal vertebral or basilar stenosis. The SCA and PCA origins remain patent and appear stable. Posterior communicating arteries are diminutive or absent. Bilateral PCA branches appear stable with suggestion of mild to moderate atherosclerotic irregularity. Stable antegrade flow in both ICA siphons. Right greater than left supraclinoid siphon irregularity compatible with atherosclerosis with mild to moderate stenosis, greater on the right, appears stable since 2016. Normal ophthalmic artery origins. Patent carotid termini. Normal MCA and ACA origins. Visible ACA branches are within normal limits. Left MCA M1 and visible left MCA branches are within normal limits. Right MCA M1 segment is tortuous and irregular with chronic distal M1 and right MCA bifurcation atherosclerosis and up to high-grade stenosis suspected. No definite right MCA branch occlusion. IMPRESSION: 1. Positive for acute hemorrhagic infarct in the right pons. Estimated blood products volume 1-2 mm. No intraventricular or extra-axial extension of blood. Brainstem edema tracking into the right cerebral and cerebellar peduncles. However, the ventricular system and basilar cisterns remain patent. 2. Intracranial MRA appears stable since 2016, no large vessel occlusion. Chronic bilateral distal ICA, bilateral PCA, the and right MCA atherosclerosis and stenosis. 3.  Progressed  cerebral white matter and deep gray matter T2 signal abnormality since 2016, favor small vessel disease related. Electronically Signed: By: Odessa Fleming M.D. On: 01/08/2018 08:19   Dg Chest Port 1 View  Result Date: 01/06/2018 CLINICAL DATA:  Fever.  Atrial fibrillation. EXAM: PORTABLE CHEST 1 VIEW COMPARISON:  01/04/2018 FINDINGS: Heart size is normal. Chronic aortic atherosclerosis seen. The right lung remains clear except for minimal linear atelectasis at base. There has been resolution of left suprahilar atelectasis on the left. Slight worsening of atelectasis left lower lobe. IMPRESSION: Improved aeration of the left upper lobe. Slight worsening of atelectasis in both lower lobes. Heart failure. Electronically Signed   By: Paulina Fusi M.D.   On: 01/06/2018 07:42   Dg Chest Port 1 View  Result Date: 01/04/2018 CLINICAL DATA:  Fever and altered level of consciousness. EXAM: PORTABLE CHEST 1 VIEW COMPARISON:  None. FINDINGS: The patient is tilted and rotated to the left. Heart size is top-normal with tortuous atherosclerotic aorta. No overt pulmonary edema, effusion or pneumothorax. Summation of overlapping ribs and pulmonary vessels in the left upper lobe are believed to account for the increased opacity seen in the left upper lobe. Pneumonia is believed less likely. IMPRESSION: Aortic atherosclerosis with borderline cardiomegaly. No active pulmonary disease. Electronically Signed   By: Tollie Eth M.D.   On: 01/04/2018 23:48   Dg Abd Portable 2 Views  Result Date: 01/05/2018 CLINICAL DATA:  Fever and altered mental status. History of volvulus. EXAM: PORTABLE ABDOMEN - 2 VIEW COMPARISON:  Radiographs and CT 02/12/2016 FINDINGS: Gas-filled distended tortuous sigmoid colon extending into the abdomen appears similar to prior radiograph. Moderate stool in the remainder the colon. No small bowel dilatation. No evidence of free air. Postsurgical change of both hips. The bones are under mineralized.  IMPRESSION: Gaseous distension of tortuous sigmoid colon appears similar to prior. While radiograph findings are concerning for possible volvulus, CT at that time demonstrated tortuous sigmoid colon without evidence of volvulus. This may be patient's baseline, and similar colonic findings were seen on pelvic radiograph 05/02/2017. Electronically Signed   By: Rubye Oaks M.D.   On: 01/05/2018 01:46      Subjective: Patient is dysarthric and difficult to comprehend  Discharge Exam: Vitals:   01/08/18 1454 01/08/18 1500  BP:  (!) 167/107  Pulse: 64 63  Resp: 18 11  Temp:    SpO2: 97%    Vitals:   01/08/18 1300 01/08/18 1400 01/08/18 1454 01/08/18 1500  BP: (!) 192/74 (!) 184/80  (!) 167/107  Pulse: (!) 51 (!) 57 64 63  Resp: 13 16 18 11   Temp:      TempSrc:      SpO2:   97%   Weight:      Height:        General: not in acute distress Cardiovascular: RRR, S1/S2 +, no rubs, no gallops Respiratory: CTA bilaterally, no wheezing, no rhonchi Abdominal: Soft, NT, ND, bowel sounds + Extremities: no edema, no cyanosis    The results of significant diagnostics from this hospitalization (including imaging, microbiology, ancillary and laboratory) are listed below for reference.     Microbiology: Recent Results (from the past 240 hour(s))  Blood Culture (routine x 2)     Status: None (Preliminary result)   Collection Time: 01/04/18 11:19 PM  Result Value Ref Range Status   Specimen Description BLOOD LEFT WRIST  Final   Special Requests   Final    BOTTLES DRAWN AEROBIC AND ANAEROBIC Blood Culture  results may not be optimal due to an excessive volume of blood received in culture bottles DRAWN BY RN   Culture   Final    NO GROWTH 4 DAYS Performed at [facility], [address] [place], [place] [address]    Report Status PENDING  Incomplete  Blood Culture (routine x 2)     Status: None (Preliminary result)   Collection Time: 01/04/18 11:26 PM  Result Value Ref Range Status    Specimen Description BLOOD LEFT HAND  Final   Special Requests   Final    BOTTLES DRAWN AEROBIC AND ANAEROBIC Blood Culture adequate volume DRAWN BY RN   Culture   Final    NO GROWTH 4 DAYS Performed at [facility], [address] [place], [place] [address]    Report Status PENDING  Incomplete  Urine culture     Status: Abnormal   Collection Time: 01/05/18 12:21 AM  Result Value Ref Range Status   Specimen Description   Final    URINE, CATHETERIZED Performed at [facility], [address] [place], [place] [address]    Special Requests   Final    URINE, CATHETERIZED Performed at [facility], [address] [place], [place] [address]    Culture >=100,000 COLONIES/mL ESCHERICHIA COLI (A)  Final   Report Status 01/07/2018 FINAL  Final   Organism ID, Bacteria ESCHERICHIA COLI (A)  Final      Susceptibility   Escherichia coli - MIC*    AMPICILLIN 8 SENSITIVE Sensitive     CEFAZOLIN <=4 SENSITIVE Sensitive     CEFTRIAXONE <=1 SENSITIVE Sensitive     CIPROFLOXACIN >=4 RESISTANT Resistant     GENTAMICIN <=1 SENSITIVE Sensitive     IMIPENEM <=0.25 SENSITIVE Sensitive     NITROFURANTOIN 128 RESISTANT Resistant     TRIMETH/SULFA >=320 RESISTANT Resistant     AMPICILLIN/SULBACTAM <=2 SENSITIVE Sensitive     PIP/TAZO <=4 SENSITIVE Sensitive     Extended ESBL NEGATIVE Sensitive     * >=100,000 COLONIES/mL ESCHERICHIA COLI  MRSA PCR Screening     Status: None   Collection Time: 01/05/18  2:10 AM  Result Value Ref Range Status   MRSA by PCR NEGATIVE NEGATIVE Final    Comment:        The GeneXpert MRSA Assay (FDA approved for NASAL specimens only), is one component of a comprehensive MRSA colonization surveillance program. It is not intended to diagnose MRSA infection nor to guide or monitor treatment for MRSA infections. Performed at [facility], [address] [place], [place] [address]      Labs: BNP (last 3 results) No results for input(s): BNP in the last 8760  hours. Basic Metabolic Panel: Recent Labs  Lab 01/04/18 2318 01/05/18 0508 01/06/18 0543 01/07/18 0426  NA 143 144 144 145  K 2.4* 3.5 3.8 3.7  CL 104 109 109 112*  CO2 31 31 29 27   GLUCOSE 97 87 88 83  BUN 24* 19 21* 21*  CREATININE 0.85 0.82 0.63 0.64  CALCIUM 8.7* 8.2* 8.8* 8.8*  MG 1.9  --   --   --   PHOS  --  3.0  --   --    Liver Function Tests: Recent Labs  Lab 01/04/18 2318 01/05/18 0508  AST 20 20  ALT 15 14  ALKPHOS 69 71  BILITOT 0.8 0.9  PROT 6.7 6.3*  ALBUMIN 3.3* 2.9*   No results for input(s): LIPASE, AMYLASE in the last 168 hours. Recent Labs  Lab 01/05/18 1124  AMMONIA 17   CBC: Recent Labs  Lab 01/04/18 2318 01/05/18 0508 01/06/18 0543 01/07/18 0426 01/08/18 0511  WBC 5.2 6.3 5.2 4.5 4.5  NEUTROABS 3.5 3.8 2.9 2.4 2.8  HGB 11.5* 11.3* 11.6* 10.7* 10.3*  HCT 35.4* 35.3* 36.3 33.9* 32.3*  MCV 95.9 96.7 96.5 97.4 97.9  PLT 189 179 172 163 161   Cardiac Enzymes: No results for input(s): CKTOTAL, CKMB, CKMBINDEX, TROPONINI in the last 168 hours. BNP: Invalid input(s): POCBNP CBG: No results for input(s): GLUCAP in the last 168 hours. D-Dimer No results for input(s): DDIMER in the last 72 hours. Hgb A1c No results for input(s): HGBA1C in the last 72 hours. Lipid Profile No results for input(s): CHOL, HDL, LDLCALC, TRIG, CHOLHDL, LDLDIRECT in the last 72 hours. Thyroid function studies No results for input(s): TSH, T4TOTAL, T3FREE, THYROIDAB in the last 72 hours.  Invalid input(s): FREET3 Anemia work up No results for input(s): VITAMINB12, FOLATE, FERRITIN, TIBC, IRON, RETICCTPCT in the last 72 hours. Urinalysis    Component Value Date/Time   COLORURINE AMBER (A) 01/04/2018 0021   APPEARANCEUR CLOUDY (A) 01/04/2018 0021   LABSPEC 1.021 01/04/2018 0021   PHURINE 5.0 01/04/2018 0021   GLUCOSEU NEGATIVE 01/04/2018 0021   HGBUR MODERATE (A) 01/04/2018 0021   BILIRUBINUR NEGATIVE 01/04/2018 0021   KETONESUR 20 (A) 01/04/2018 0021    PROTEINUR 30 (A) 01/04/2018 0021   UROBILINOGEN 2.0 (H) 04/02/2015 1945   NITRITE NEGATIVE 01/04/2018 0021   LEUKOCYTESUR NEGATIVE 01/04/2018 0021   Sepsis Labs Invalid input(s): PROCALCITONIN,  WBC,  LACTICIDVEN Microbiology Recent Results (from the past 240 hour(s))  Blood Culture (routine x 2)     Status: None (Preliminary result)   Collection Time: 01/04/18 11:19 PM  Result Value Ref Range Status   Specimen Description BLOOD LEFT WRIST  Final   Special Requests   Final    BOTTLES DRAWN AEROBIC AND ANAEROBIC Blood Culture results may not be optimal due to an excessive volume of blood received in culture bottles DRAWN BY RN   Culture   Final    NO GROWTH 4 DAYS Performed at [facility], [address], [place], [place] [address]    Report Status PENDING  Incomplete  Blood Culture (routine x 2)     Status: None (Preliminary result)   Collection Time: 01/04/18 11:26 PM  Result Value Ref Range Status   Specimen Description BLOOD LEFT HAND  Final   Special Requests   Final    BOTTLES DRAWN AEROBIC AND ANAEROBIC Blood Culture adequate volume DRAWN BY RN   Culture   Final    NO GROWTH 4 DAYS Performed at [facility], [address] [place], [place] [address]    Report Status PENDING  Incomplete  Urine culture     Status: Abnormal   Collection Time: 01/05/18 12:21 AM  Result Value Ref Range Status   Specimen Description   Final    URINE, CATHETERIZED Performed at [facility], [address], [place], [place] [address]    Special Requests   Final    URINE, CATHETERIZED Performed at [facility], [address] [place], [place] [address]    Culture >=100,000 COLONIES/mL ESCHERICHIA COLI (A)  Final   Report Status 01/07/2018 FINAL  Final   Organism ID, Bacteria ESCHERICHIA COLI (A)  Final      Susceptibility   Escherichia coli - MIC*    AMPICILLIN 8 SENSITIVE Sensitive     CEFAZOLIN <=4 SENSITIVE Sensitive  CEFTRIAXONE <=1 SENSITIVE Sensitive     CIPROFLOXACIN >=4  RESISTANT Resistant     GENTAMICIN <=1 SENSITIVE Sensitive     IMIPENEM <=0.25 SENSITIVE Sensitive     NITROFURANTOIN 128 RESISTANT Resistant     TRIMETH/SULFA >=320 RESISTANT Resistant     AMPICILLIN/SULBACTAM <=2 SENSITIVE Sensitive     PIP/TAZO <=4 SENSITIVE Sensitive     Extended ESBL NEGATIVE Sensitive     * >=100,000 COLONIES/mL ESCHERICHIA COLI  MRSA PCR Screening     Status: None   Collection Time: 01/05/18  2:10 AM  Result Value Ref Range Status   MRSA by PCR NEGATIVE NEGATIVE Final    Comment:        The GeneXpert MRSA Assay (FDA approved for NASAL specimens only), is one component of a comprehensive MRSA colonization surveillance program. It is not intended to diagnose MRSA infection nor to guide or monitor treatment for MRSA infections. Performed at [facility], [address], [place], [place] [address]      Time coordinating discharge:  SIGNED:   Erick Blinks, MD  [facility] 01/08/2018, 3:45 PM Pager   If 7PM-7AM, please contact night-coverage [email or web address] Password [email or web address]

## 2018-01-08 NOTE — Progress Notes (Signed)
Message left for Dr.[place] regarding change in patient's plan of care and d/c neurology consult.

## 2018-01-08 NOTE — Consult Note (Signed)
Consultation Note Date: 01/08/2018   Patient Name: Joan Mathis  DOB: 07-Nov-1925  MRN: [id number]  Age / Sex: 84 y.o., female  PCP: Bernerd Limbo, MD Referring Physician: Erick Blinks, MD  Reason for Consultation: Establishing goals of care and Inpatient hospice referral  HPI/Patient Profile: 84 y.o. female  with past medical history of arthrosclerotic cardiovascular disease, A. 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   Clinical Assessment and Goals of Care: Conference with nursing staff related to patient condition, plan of care, symptom management. Joan Mathis is resting quietly in bed.  She will briefly open her eyes and make eye contact that she is unable to express her basic needs.  She is taking able to take a small ice chip, roll it around her mouth and swallow.  Her second ice chip however, sits on the edge of her tongue.  Head of the bed is raised.  There is no family at bedside at this time.  Call to daughter, Joan Mathis.  We discussed Ms. Mathis's acute and chronic health problems.  We discussed the acute hemorrhagic stroke.  Joan Mathis states that her daughter, Joan Mathis, is a CNA.  Joan states that Joan Mathis mentioned this morning that she thought Joan Mathis had had a stroke.  Joan Mathis shares that Joan Mathis has lived in the residential SNF for approximately 1 year.  Prior to that she had been in [place], fell and broke her hip. Joan Mathis states that her mother has had "snowballed" decline in health in particular since her husband died in 88. 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 Joan Mathis states that  she and her brother Joan Mathis were adopted, they are twins.  She tells a story of Joan Mathis receiving the phone call that there was a boy and a girl available, she went outside to talk with her husband asking, "can I have both?". Joan Mathis is tearful talking about her mother's love for Joan Mathis and her brother.  I share that I can also hear her love for her mother.  We talked about CODE STATUS.  Joan Mathis states that her mother and father chose DNR.  We talked about how wonderful that she was able to hear her mother's choice.  We also talked about the concept of "let nature take its course", comfort and dignity at end of life, residential hospice.  I ask if Joan Mathis needs time to consider.  She quickly states no, that she wants comfort and dignity, residential hospice.  Joan Mathis asks if it is time to call and her son.  We talked about prognosis with permission.  I share that only God knows, but 2 weeks or less would not be surprising.  We talked about the logistics of residential hospice, what is and is not provided.  I share that we will make  a referral today, hospice worker will contact her.  Conference with hospitalist, Dr. Kerry HoughMemon related to plan of care.  Healthcare power of attorney HCPOA -daughter, Joan ChaseShawn Mathis, states that she is legal healthcare power of attorney.  No paperwork in epic chart.  Son Joan Mathis is disabled, difficulty walking.   SUMMARY OF RECOMMENDATIONS   Full comfort care, comfort and dignity at end of life. [facility].  Code Status/Advance Care Planning:  DNR  Symptom Management:   Symptom management liberalized for comfort  Palliative Prophylaxis:   Aspiration, Frequent Pain Assessment, Oral Care and Turn Reposition  Additional Recommendations (Limitations, Scope, Preferences):  Full Comfort Care  Psycho-social/Spiritual:   Desire for further Chaplaincy support:no  Additional Recommendations: Caregiving   Support/Resources and Education on Hospice  Prognosis:   < 2 weeks stone functional decline, new hemorrhagic stroke, dysphasia with n.p.o. status, lethargy with inability to take by mouth nutrition, family's desire to focus on comfort and dignity, let nature take its course.  Discharge Planning: [facility]      Primary Diagnoses: Present on Admission: . Sepsis due to undetermined organism (HCC) . Essential hypertension, benign . CAD, NATIVE VESSEL . Hyperlipidemia . Dementia without behavioral disturbance . Hypokalemia . Altered mental status . Chronic atrial fibrillation (HCC) . Acute encephalopathy . Acute lower UTI   I have reviewed the medical record, interviewed the patient and family, and examined the patient. The following aspects are pertinent.  Past Medical History:  Diagnosis Date  . Acid reflux   . Arthritis   . Atherosclerotic cardiovascular disease   . Atrial fibrillation (HCC)   . Blindness    legal blindness,left eye,histor  . Dementia   . Dysphagia   . FH: dementia   . H/O: stroke    ocular stroke,   . Hypercholesteremia   . Hyperlipemia   . Hypertension   . Postmenopausal   . Right kidney mass 02/14/2016   Consistent with a renal cyst per ultrasound   . Seizures (HCC)   . Stroke [facility](HCC)    tia per nursing facilities  . Trochanteric bursitis    Social History   Socioeconomic History  . Marital status: Widowed    Spouse name: Not on file  . Number of children: Not on file  . Years of education: Not on file  . Highest education level: Not on file  Occupational History  . Not on file  Social Needs  . Financial resource strain: Not on file  . Food insecurity:    Worry: Not on file    Inability: Not on file  . Transportation needs:    Medical: Not on file    Non-medical: Not on file  Tobacco Use  . Smoking status: Never Smoker  . Smokeless tobacco: Never Used  Substance and Sexual Activity  . Alcohol use: No  .  Drug use: No  . Sexual activity: Not on file  Lifestyle  . Physical activity:    Days per week: Not on file    Minutes per session: Not on file  . Stress: Not on file  Relationships  . Social connections:    Talks on phone: Not on file    Gets together: Not on file    Attends religious service: Not on file    Active member of club or organization: Not on file    Attends meetings of clubs or organizations: Not on file    Relationship status: Not on file  Other Topics Concern  . Not on file  Social History Narrative  . Not on file   History reviewed. No pertinent family history. Scheduled Meds: . amLODipine  5 mg Oral Daily  . aspirin EC  81 mg Oral Daily  . clonazePAM  0.25 mg Oral BID  . enoxaparin (LOVENOX) injection  40 mg Subcutaneous Q24H  . Eslicarbazepine Acetate  1 tablet Oral Daily  . magnesium oxide  200 mg Oral Daily  . mouth rinse  15 mL Mouth Rinse BID  . pantoprazole  40 mg Oral Daily  . polyethylene glycol  17 g Oral Daily  . sertraline  25 mg Oral Daily  . cyanocobalamin  2,000 mcg Oral Daily   Continuous Infusions: . 0.9 % NaCl with KCl 40 mEq / L 75 mL/hr at 01/08/18 0400  . levETIRAcetam Stopped (01/08/18 0323)  . piperacillin-tazobactam (ZOSYN)  IV Stopped (01/08/18 0933)   PRN Meds:.acetaminophen **OR** acetaminophen, hydrALAZINE, ondansetron **OR** ondansetron (ZOFRAN) IV Medications Prior to Admission:  Prior to Admission medications   Medication Sig Start Date End Date Taking? Authorizing Provider  Amino Acids-Protein Hydrolys (FEEDING SUPPLEMENT, PRO-STAT SUGAR FREE 64,) LIQD Take 30 mLs by mouth 2 (two) times daily. 02/15/16  Yes Elliot Cousin, MD  amLODipine (NORVASC) 5 MG tablet Take 1 tablet (5 mg total) by mouth daily. 03/26/17  Yes Erick Blinks, MD  aspirin 81 MG EC tablet Take 1 tablet (81 mg total) by mouth at bedtime. Swallow whole. Patient taking differently: Take 81 mg by mouth daily. Swallow whole. 02/15/16  Yes Elliot Cousin, MD    clonazePAM (KLONOPIN) 0.5 MG tablet Take 0.25 mg by mouth 2 (two) times daily.   Yes [provider]  cyanocobalamin 2000 MCG tablet Take 2,000 mcg by mouth daily.   Yes [provider]  Eslicarbazepine Acetate 400 MG TABS Take 1 tablet by mouth daily.   Yes [provider]  levETIRAcetam (KEPPRA) 500 MG tablet Take 500 mg by mouth 2 (two) times daily.   Yes [provider]  Magnesium Oxide 200 MG TABS Take 1 tablet (200 mg total) by mouth daily. 02/15/16  Yes Elliot Cousin, MD  omeprazole (PRILOSEC) 20 MG capsule Take 20 mg by mouth daily.   Yes [provider]  polyethylene glycol (MIRALAX / GLYCOLAX) packet Take 17 g by mouth 2 (two) times daily. Patient taking differently: Take 17 g by mouth daily.  02/15/16  Yes Elliot Cousin, MD  sertraline (ZOLOFT) 25 MG tablet Take 25 mg by mouth daily.   Yes [provider]   No Known Allergies Review of Systems  Unable to perform ROS: Dementia    Physical Exam  Constitutional: No distress.  Appears acutely/chronically ill, frail thin  HENT:  Head: Atraumatic.  Temporal wasting  Cardiovascular: Normal rate.  Pulmonary/Chest: Effort normal. No respiratory distress.  Abdominal: Soft. She exhibits no distension.  Musculoskeletal: She exhibits no edema.  Frail and thin  Neurological:  Known dementia, briefly opens eyes  Skin: Skin is warm and dry.  Psychiatric:  Known dementia  Nursing note and vitals reviewed.   Vital Signs: BP (!) 177/127   Pulse (!) 52   Temp 97.7 F (36.5 C) (Oral)   Resp 12   Ht 5\' 6"  (1.676 m)   Wt 58.8 kg (129 lb 10.1 oz)   SpO2 99%   BMI 20.92 kg/m  Pain Mathis: PAINAD POSS *See Group Information*: 1-Acceptable,Awake and alert Pain Score: Asleep   SpO2: SpO2: 99 % O2 Device:SpO2:  99 % O2 Flow Rate: .O2 Flow Rate (L/min): 2 L/min  IO: Intake/output summary:   Intake/Output Summary (Last 24 hours) at 01/08/2018 1243 Last data filed at 01/08/2018  1240 Gross per 24 hour  Intake 2168.13 ml  Output 600 ml  Net 1568.13 ml    LBM:   Baseline Weight: Weight: 56.2 kg (123 lb 14.4 oz) Most recent weight: Weight: 58.8 kg (129 lb 10.1 oz)     Palliative Assessment/Data:   Flowsheet Rows     Most Recent Value  Intake Tab  Referral Department  Hospitalist  Unit at Time of Referral  Cardiac/Telemetry Unit  Palliative Care Primary Diagnosis  Neurology  Date Notified  01/07/18  Palliative Care Type  New Palliative care  Reason for referral  Clarify Goals of Care, End of Life Care Assistance, Counsel Regarding Hospice  Date of Admission  01/04/18  Date first seen by Palliative Care  01/08/18  # of days Palliative referral response time  1 Day(s)  # of days IP prior to Palliative referral  3  Clinical Assessment  Palliative Performance Mathis Score  10%  Pain Max last 24 hours  Not able to report  Pain Min Last 24 hours  Not able to report  Dyspnea Max Last 24 Hours  Not able to report  Dyspnea Min Last 24 hours  Not able to report  Psychosocial & Spiritual Assessment  Palliative Care Outcomes  Patient/Family meeting held?  Yes  Who was at the meeting?  Daughter/HC POA, Joan Mathis via phone  Palliative Care Outcomes  Counseled regarding hospice, Provided advance care planning, Changed to focus on comfort, Provided psychosocial or spiritual support, Provided end of life care assistance, Clarified goals of care, Transitioned to hospice  Patient/Family wishes: Interventions discontinued/not started   Mechanical Ventilation, Tube feedings/TPN, PEG      Time In: 0910 Time Out: 1030 Time Total: 80 minutes Greater than 50%  of this time was spent counseling and coordinating care related to the above assessment and plan.  Signed by: Katheran Awe, NP   Please contact Palliative Medicine Team phone at [phone] for questions and concerns.  For individual provider: See Loretha Stapler

## 2018-01-08 NOTE — Progress Notes (Signed)
Patient now comfort care. IVF stopped as ordered. Awaiting residential hospice placement at this time.

## 2018-01-08 NOTE — Progress Notes (Signed)
[facility] [place] non-emergent transport arrived to pick up patient for transport to [facility]. Paperwork, hard Rxs, and DNR given to EMS staff. IV catheters removed from patient's LEFT wrist region and LEFT AC, no complications noted at this time. [facility] notified to give report on patient to receiving nurse, nurse busy, message left to call this Clinical research associatewriter for report. Patient's daughter Joan Bloomer(Shawn) notified and made aware that patient has left facility in route to Hospice.

## 2018-01-08 NOTE — Progress Notes (Signed)
1548 Spoke with Tretha SciaraHeather Settle, SW who reported that patient is ready for transfer to [facility] and that EMS non-emergent transport has been called to pick up patient. Patient d/c summary printed and placed in envelope along with DNR & hard Rxs.

## 2018-01-08 NOTE — Clinical Social Work Note (Signed)
Received consult from Palliative Care APNP for residential hospice referral at daughter's request. Referral sent. Will await their review and status of residence bed availability.   CSW will follow.

## 2018-01-09 LAB — CULTURE, BLOOD (ROUTINE X 2)
Culture: NO GROWTH
Culture: NO GROWTH

## 2018-02-26 DEATH — deceased

## 2018-10-16 IMAGING — CT CT CERVICAL SPINE W/O CM
3 of 9 series · 11 of 33 positions shown, 13 images · non-contrast
Comparison: None.

CLINICAL DATA: Unwitnessed fall.  Laceration above the right eye.

EXAM:
CT HEAD WITHOUT CONTRAST
CT CERVICAL SPINE WITHOUT CONTRAST
TECHNIQUE: Multidetector CT imaging of the head and cervical spine was
performed following the standard protocol without intravenous
contrast. Multiplanar CT image reconstructions of the cervical spine
were also generated.

[Series 6: head wo · axial · 0.40mm/px · z∈[+58,+118]mm · 2 of 36 slices shown]
[im 12/36  bone]
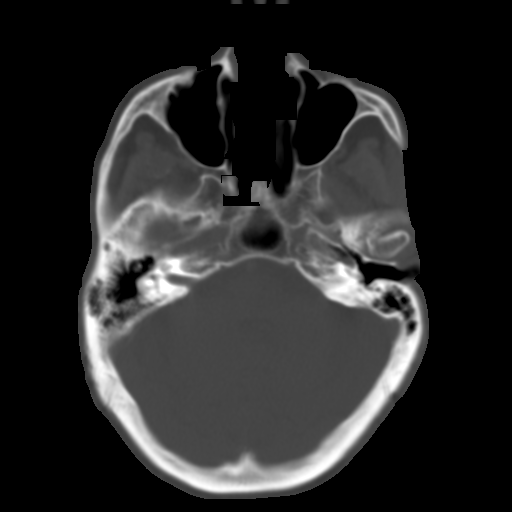
[im 24/36  bone]
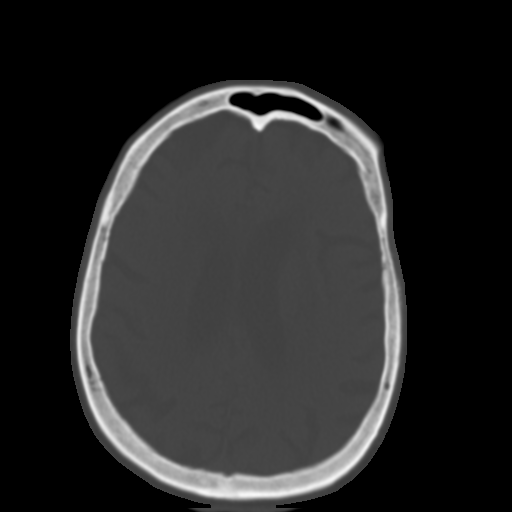

[Series 17: c spine soft · axial · 0.44mm/px · z∈[-91,+9]mm · 6 of 70 slices shown, 8 images]
[im 10/70  soft-tissue]
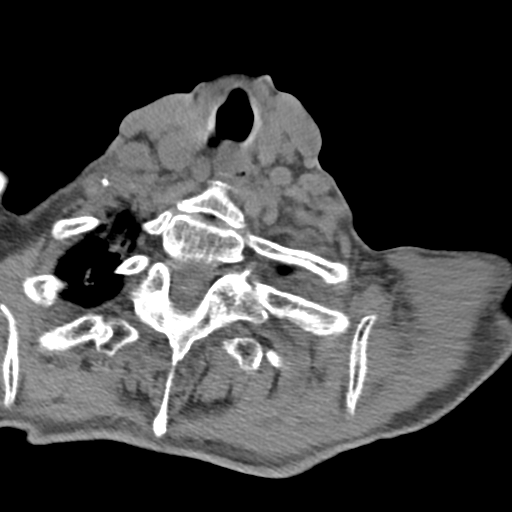
[im 10/70  bone]
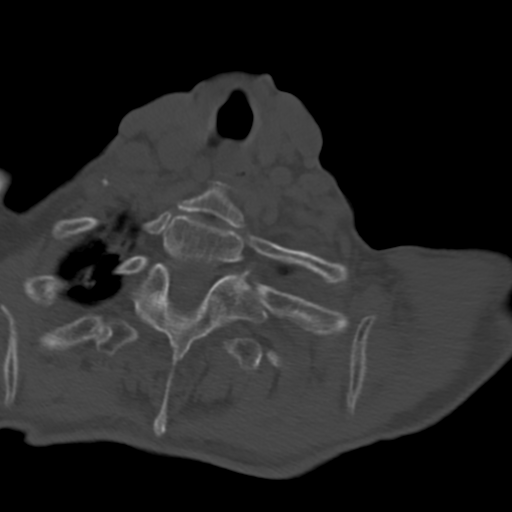
[im 20/70  bone]
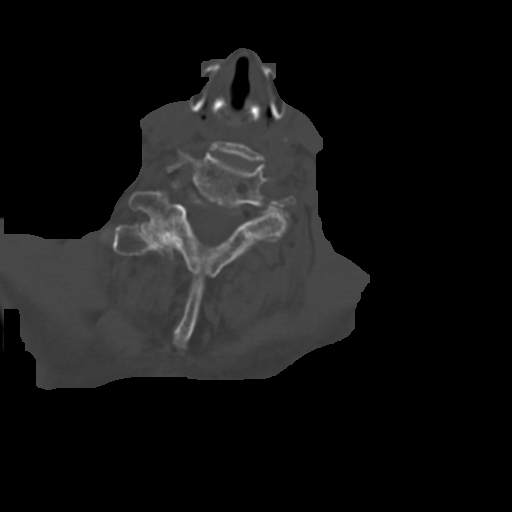
[im 30/70  bone]
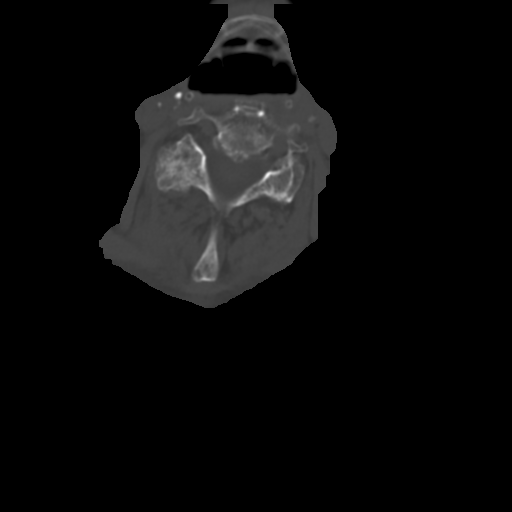
[im 40/70  bone]
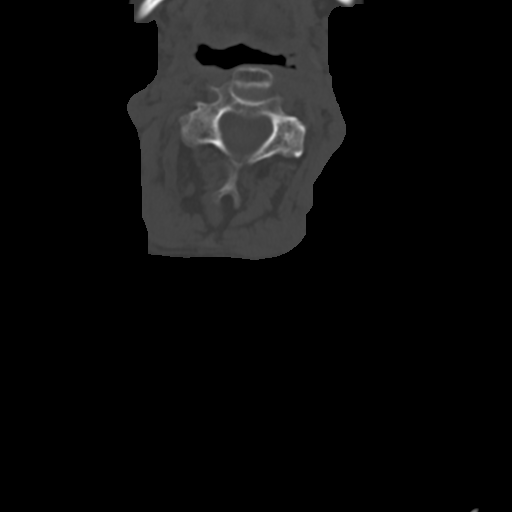
[im 50/70  soft-tissue]
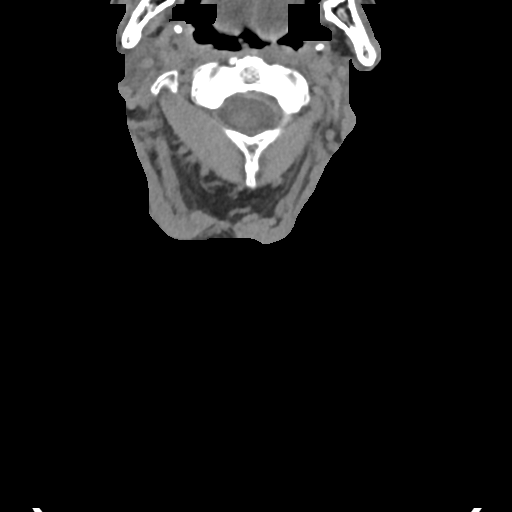
[im 50/70  bone]
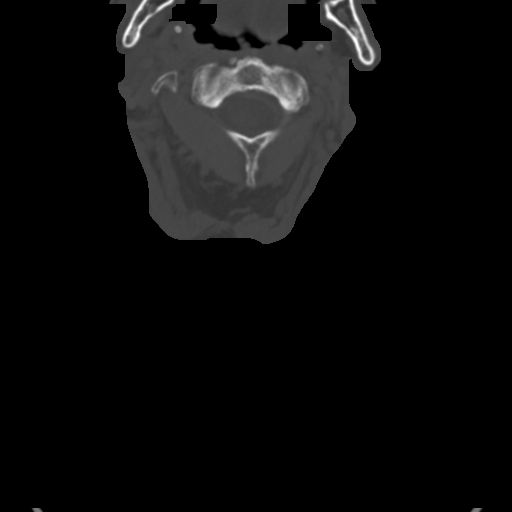
[im 60/70  bone]
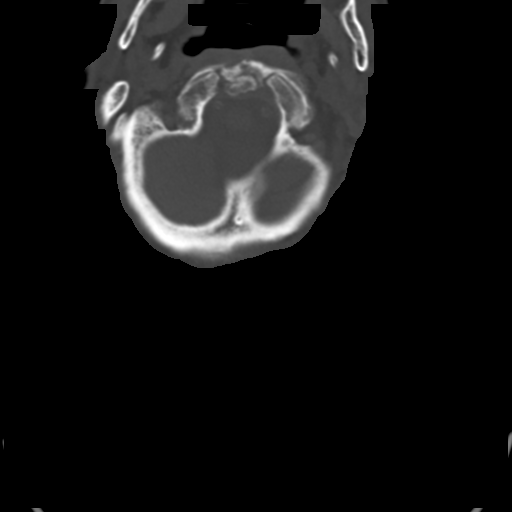

[Series 18: sagittal bone · sagittal · 0.18mm/px · 3 of 61 slices shown]
[im 16/61  bone]
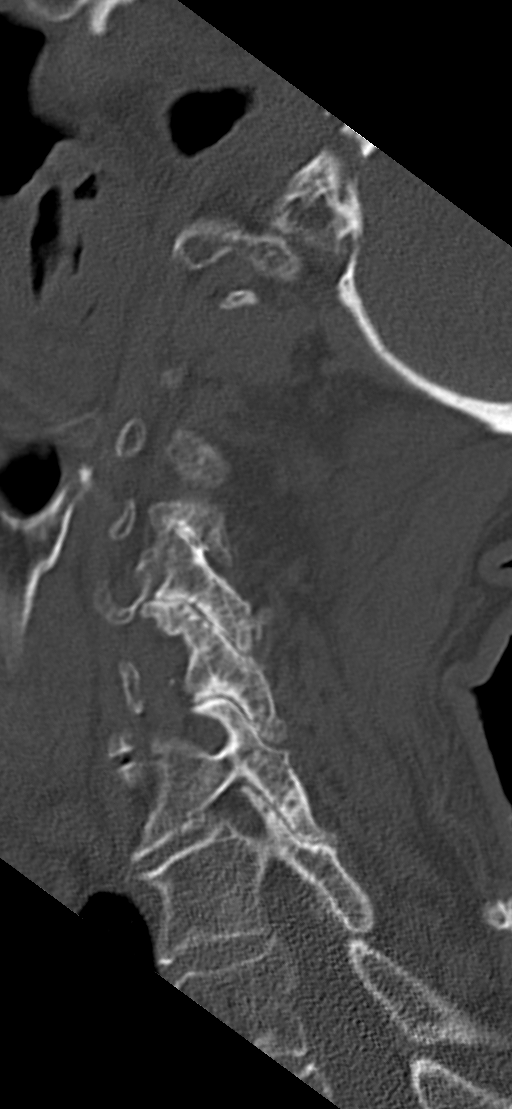
[im 31/61  bone]
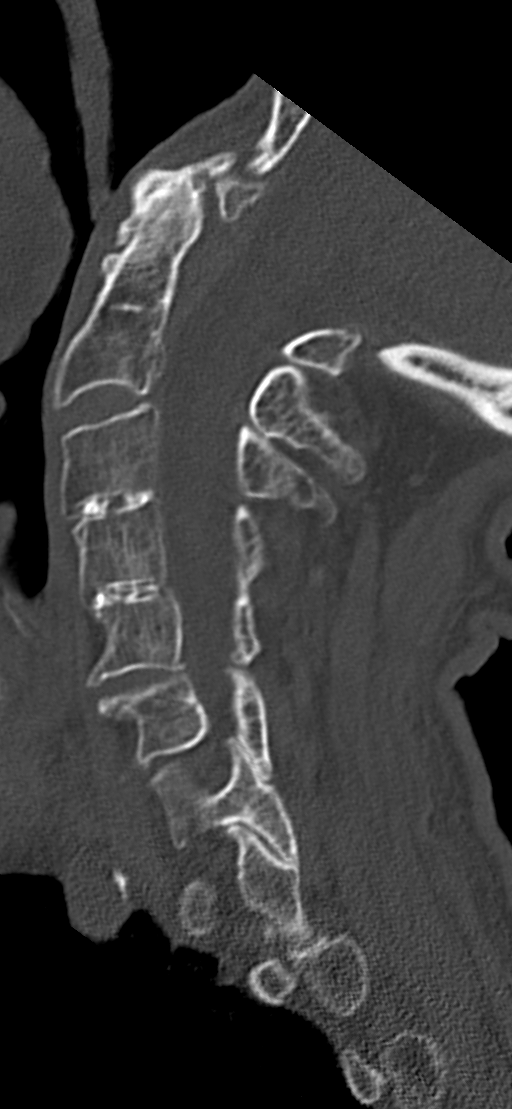
[im 46/61  bone]
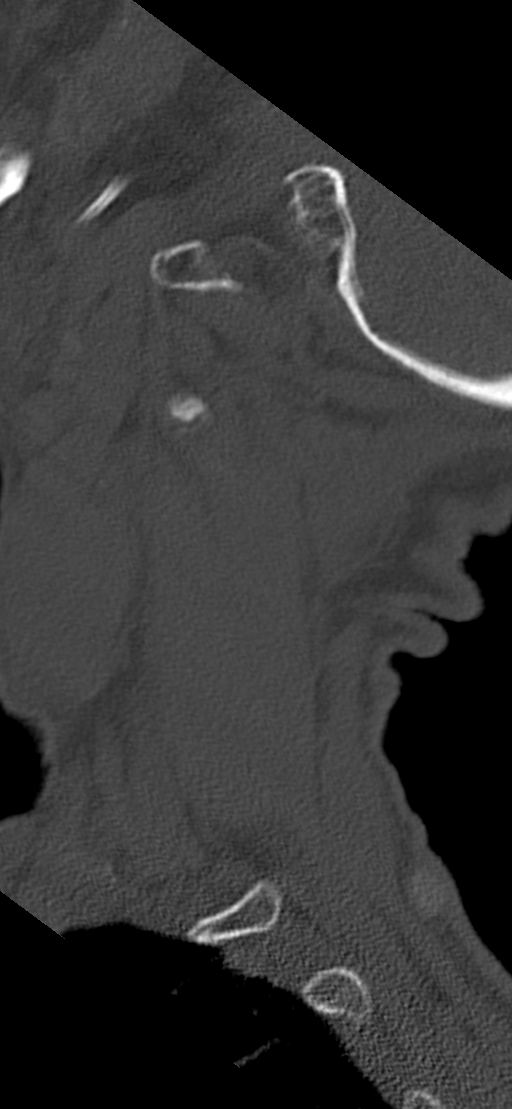

[11 of 33 positions shown; findings below may reference images not displayed]

FINDINGS: CT HEAD FINDINGS

Brain: No evidence of acute infarction, hemorrhage, extra-axial
collection, ventriculomegaly, or mass effect. Generalized cerebral
atrophy. Periventricular white matter low attenuation likely
secondary to microangiopathy.

Vascular: Cerebrovascular atherosclerotic calcifications are noted.

Skull: Negative for fracture or focal lesion.

Sinuses/Orbits: Visualized portions of the orbits are unremarkable.
Visualized portions of the paranasal sinuses and mastoid air cells
are unremarkable.

Other: None.

CT CERVICAL SPINE FINDINGS

Alignment: Normal.

Skull base and vertebrae: No acute fracture. No primary bone lesion
or focal pathologic process.

Soft tissues and spinal canal: No prevertebral fluid or swelling. No
visible canal hematoma.

Disc levels: Degenerative disc disease with disc height loss at C6-7
and C7-T1. Severe left facet arthropathy at C2-3. Mild right facet
arthropathy at C2-3. Osseous fusion of the posterior elements at
C3-4 and C4-5. Moderate bilateral facet arthropathy at C5-6 and
C6-7.

Upper chest: The lung apices are clear.

Other: No fluid collection or hematoma. Bilateral carotid artery
atherosclerosis.
IMPRESSION: 1. No acute intracranial pathology.
2. No acute osseous injury of the cervical spine.
